# Patient Record
Sex: Female | Born: 1975 | Race: White | Hispanic: No | Marital: Married | Smoking: Never smoker
Health system: Southern US, Community
[De-identification: ages and names within clinical notes are randomized; demographics above are authoritative.]

## PROBLEM LIST (undated history)

## (undated) DIAGNOSIS — I10 Essential (primary) hypertension: Secondary | ICD-10-CM

## (undated) HISTORY — PX: ANTERIOR CRUCIATE LIGAMENT REPAIR: SHX115

## (undated) HISTORY — PX: TONSILLECTOMY: SUR1361

## (undated) HISTORY — PX: DENTAL SURGERY: SHX609

---

## 1998-03-25 ENCOUNTER — Other Ambulatory Visit: Admission: RE | Admit: 1998-03-25 | Discharge: 1998-03-25 | Payer: Self-pay | Admitting: Obstetrics and Gynecology

## 1999-01-06 HISTORY — PX: ANTERIOR CRUCIATE LIGAMENT REPAIR: SHX115

## 1999-07-14 ENCOUNTER — Other Ambulatory Visit: Admission: RE | Admit: 1999-07-14 | Discharge: 1999-07-14 | Payer: Self-pay | Admitting: Obstetrics and Gynecology

## 1999-09-05 ENCOUNTER — Encounter (INDEPENDENT_AMBULATORY_CARE_PROVIDER_SITE_OTHER): Payer: Self-pay | Admitting: Specialist

## 1999-09-05 ENCOUNTER — Other Ambulatory Visit: Admission: RE | Admit: 1999-09-05 | Discharge: 1999-09-05 | Payer: Self-pay | Admitting: Obstetrics and Gynecology

## 1999-12-09 ENCOUNTER — Other Ambulatory Visit: Admission: RE | Admit: 1999-12-09 | Discharge: 1999-12-09 | Payer: Self-pay | Admitting: Obstetrics and Gynecology

## 1999-12-09 ENCOUNTER — Encounter (INDEPENDENT_AMBULATORY_CARE_PROVIDER_SITE_OTHER): Payer: Self-pay

## 2000-01-15 ENCOUNTER — Emergency Department (HOSPITAL_COMMUNITY): Admission: EM | Admit: 2000-01-15 | Discharge: 2000-01-16 | Payer: Self-pay

## 2000-04-04 ENCOUNTER — Emergency Department (HOSPITAL_COMMUNITY): Admission: EM | Admit: 2000-04-04 | Discharge: 2000-04-04 | Payer: Self-pay | Admitting: Emergency Medicine

## 2000-04-04 ENCOUNTER — Encounter: Payer: Self-pay | Admitting: Emergency Medicine

## 2003-02-21 ENCOUNTER — Other Ambulatory Visit: Admission: RE | Admit: 2003-02-21 | Discharge: 2003-02-21 | Payer: Self-pay | Admitting: Obstetrics and Gynecology

## 2003-11-07 ENCOUNTER — Other Ambulatory Visit: Admission: RE | Admit: 2003-11-07 | Discharge: 2003-11-07 | Payer: Self-pay | Admitting: Obstetrics and Gynecology

## 2004-06-10 ENCOUNTER — Other Ambulatory Visit: Admission: RE | Admit: 2004-06-10 | Discharge: 2004-06-10 | Payer: Self-pay | Admitting: Obstetrics and Gynecology

## 2004-11-17 ENCOUNTER — Encounter: Admission: RE | Admit: 2004-11-17 | Discharge: 2004-11-17 | Payer: Self-pay | Admitting: Family Medicine

## 2005-03-19 ENCOUNTER — Other Ambulatory Visit: Admission: RE | Admit: 2005-03-19 | Discharge: 2005-03-19 | Payer: Self-pay | Admitting: Obstetrics and Gynecology

## 2005-07-07 ENCOUNTER — Inpatient Hospital Stay (HOSPITAL_COMMUNITY): Admission: AD | Admit: 2005-07-07 | Discharge: 2005-07-07 | Payer: Self-pay | Admitting: Obstetrics and Gynecology

## 2005-09-23 ENCOUNTER — Inpatient Hospital Stay (HOSPITAL_COMMUNITY): Admission: AD | Admit: 2005-09-23 | Discharge: 2005-09-25 | Payer: Self-pay | Admitting: Obstetrics and Gynecology

## 2006-06-11 ENCOUNTER — Other Ambulatory Visit: Admission: RE | Admit: 2006-06-11 | Discharge: 2006-06-11 | Payer: Self-pay | Admitting: Obstetrics and Gynecology

## 2007-12-22 ENCOUNTER — Other Ambulatory Visit: Admission: RE | Admit: 2007-12-22 | Discharge: 2007-12-22 | Payer: Self-pay | Admitting: Obstetrics and Gynecology

## 2008-04-04 ENCOUNTER — Encounter: Admission: RE | Admit: 2008-04-04 | Discharge: 2008-04-04 | Payer: Self-pay | Admitting: Family Medicine

## 2009-03-28 ENCOUNTER — Other Ambulatory Visit: Admission: RE | Admit: 2009-03-28 | Discharge: 2009-03-28 | Payer: Self-pay | Admitting: Obstetrics and Gynecology

## 2009-08-21 ENCOUNTER — Emergency Department (HOSPITAL_COMMUNITY): Admission: EM | Admit: 2009-08-21 | Discharge: 2009-08-21 | Payer: Self-pay | Admitting: Emergency Medicine

## 2010-03-21 LAB — D-DIMER, QUANTITATIVE: D-Dimer, Quant: <0.22 ug/mL-FEU (ref 0.00–0.48)

## 2010-04-01 ENCOUNTER — Other Ambulatory Visit (HOSPITAL_COMMUNITY)
Admission: RE | Admit: 2010-04-01 | Discharge: 2010-04-01 | Disposition: A | Discharge status: Home / Self Care | Payer: BC Managed Care – PPO | Source: Ambulatory Visit | Attending: Obstetrics and Gynecology | Admitting: Obstetrics and Gynecology

## 2010-04-01 ENCOUNTER — Other Ambulatory Visit: Payer: Self-pay | Admitting: Obstetrics and Gynecology

## 2010-04-01 DIAGNOSIS — Z01419 Encounter for gynecological examination (general) (routine) without abnormal findings: Secondary | ICD-10-CM | POA: Insufficient documentation

## 2010-05-23 NOTE — H&P (Signed)
Rachel Huynh, Rachel Huynh                   ACCOUNT NO.:  [id number]   MEDICAL RECORD NO.:  [id number]          PATIENT TYPE:  INP   LOCATION:  9166                          FACILITY:  [facility]   PHYSICIAN:  Gerald Leitz, MD          DATE OF BIRTH:  1975-08-02   DATE OF ADMISSION:  09/23/2005  DATE OF DISCHARGE:                                HISTORY & PHYSICAL   HISTORY OF PRESENT ILLNESS:  This is a 35 year old G2, P0-0-1-0 at 87 weeks'  estimated gestational age based on last menstrual period of December 31, 2004, confirmed by 7-week ultrasound with estimated date of delivery October 07, 2005.  Pregnancy is complicated by history of LEEP procedure.  Patient  presented to the office complaining of leakage of fluid which began at 9  p.m. on September 18.  She described it as clear liquid.  Irregular  contractions.  No vaginal bleeding.  Positive fetal movement.   PRENATAL LABS:  Blood type O positive.  Antibody screen negative.  RPR  nonreactive.  Rubella immune.  Hepatitis B negative.  HIV nonreactive.  Gonorrhea/chlamydia are negative.  GBS negative.   PAST MEDICAL HISTORY:  1. Mitral valve prolapse.  2. Irritable bowel syndrome.   PAST SURGICAL HISTORY:  1. Left knee replacement.  2. Tonsillectomy.  3. Oral surgery.   PAST GYN HISTORY:  1. No history of sexually transmitted diseases.  2. Patient has a history of abnormal Pap smear status-post a LEEP 6 years      ago.  Pap smears have been normal since.   PAST OB HISTORY:  Spontaneous abortion x1.   MEDICATIONS:  Prenatal vitamins.   ALLERGIES:  1. NEOSPORIN.  2. PENICILLIN.  3. AZITHROMYCIN.   FAMILY HISTORY:  Mother with type 1 diabetes.   SOCIAL HISTORY:  Patient is married.  She denies tobacco, alcohol, or  illicit drug use.   PHYSICAL EXAMINATION:  VITAL SIGNS:  Patient's weight is 246 pounds.  Blood  pressure 110/74.  Fetal heart rate was 130.  CARDIOVASCULAR:  Regular rate and rhythm.  LUNGS:  Clear to auscultation  bilaterally.  ABDOMEN:  Gravid, nontender.  EXTREMITIES:  Trace edema.  PELVIC EXAM:  Nitrazine positive.  Fern positive.  The cervix is 1 cm thick,  -2 station, vertex.   IMPRESSION AND PLAN:  A 38-week intrauterine pregnancy with rupture of  membranes.  We will admit for Cytotec, prolonged rupture, clindamycin.  Anticipate spontaneous vaginal delivery.      Gerald Leitz, MD  Electronically Signed     TC/MEDQ  D:  09/23/2005  T:  09/23/2005  Job:  [id number]

## 2010-07-30 ENCOUNTER — Ambulatory Visit: Payer: Self-pay

## 2010-07-30 ENCOUNTER — Other Ambulatory Visit: Payer: Self-pay | Admitting: Occupational Medicine

## 2010-07-30 DIAGNOSIS — R52 Pain, unspecified: Secondary | ICD-10-CM

## 2011-01-10 ENCOUNTER — Ambulatory Visit: Payer: Self-pay

## 2011-02-09 ENCOUNTER — Ambulatory Visit
Admission: RE | Admit: 2011-02-09 | Discharge: 2011-02-09 | Disposition: A | Discharge status: Home / Self Care | Payer: BC Managed Care – PPO | Source: Ambulatory Visit | Attending: Family Medicine | Admitting: Family Medicine

## 2011-02-09 ENCOUNTER — Other Ambulatory Visit: Payer: Self-pay | Admitting: Family Medicine

## 2011-02-09 MED ORDER — IOHEXOL 300 MG/ML  SOLN
100.0000 mL | Freq: Once | INTRAMUSCULAR | Status: AC | PRN
  Administered 2011-02-09: 100 mL via INTRAVENOUS

## 2011-02-11 ENCOUNTER — Other Ambulatory Visit: Payer: Self-pay | Admitting: Family Medicine

## 2011-02-11 DIAGNOSIS — R209 Unspecified disturbances of skin sensation: Secondary | ICD-10-CM

## 2011-02-12 ENCOUNTER — Ambulatory Visit
Admission: RE | Admit: 2011-02-12 | Discharge: 2011-02-12 | Disposition: A | Discharge status: Home / Self Care | Payer: BC Managed Care – PPO | Source: Ambulatory Visit | Attending: Family Medicine | Admitting: Family Medicine

## 2011-02-12 DIAGNOSIS — R209 Unspecified disturbances of skin sensation: Secondary | ICD-10-CM

## 2011-02-12 MED ORDER — GADOBENATE DIMEGLUMINE 529 MG/ML IV SOLN
17.0000 mL | Freq: Once | INTRAVENOUS | Status: AC | PRN
  Administered 2011-02-12: 17 mL via INTRAVENOUS

## 2011-04-01 ENCOUNTER — Other Ambulatory Visit: Payer: Self-pay | Admitting: Obstetrics and Gynecology

## 2011-04-01 ENCOUNTER — Other Ambulatory Visit (HOSPITAL_COMMUNITY)
Admission: RE | Admit: 2011-04-01 | Discharge: 2011-04-01 | Disposition: A | Discharge status: Home / Self Care | Payer: BC Managed Care – PPO | Source: Ambulatory Visit | Attending: Obstetrics and Gynecology | Admitting: Obstetrics and Gynecology

## 2011-04-01 DIAGNOSIS — Z01419 Encounter for gynecological examination (general) (routine) without abnormal findings: Secondary | ICD-10-CM | POA: Insufficient documentation

## 2011-04-01 DIAGNOSIS — Z1159 Encounter for screening for other viral diseases: Secondary | ICD-10-CM | POA: Insufficient documentation

## 2011-04-14 ENCOUNTER — Ambulatory Visit
Admission: RE | Admit: 2011-04-14 | Discharge: 2011-04-14 | Disposition: A | Discharge status: Home / Self Care | Payer: BC Managed Care – PPO | Source: Ambulatory Visit | Attending: Family Medicine | Admitting: Family Medicine

## 2011-04-14 ENCOUNTER — Other Ambulatory Visit: Payer: Self-pay | Admitting: Family Medicine

## 2011-04-14 DIAGNOSIS — R059 Cough, unspecified: Secondary | ICD-10-CM

## 2011-04-14 DIAGNOSIS — R05 Cough: Secondary | ICD-10-CM

## 2011-12-21 ENCOUNTER — Other Ambulatory Visit: Payer: Self-pay | Admitting: Obstetrics and Gynecology

## 2011-12-21 DIAGNOSIS — Z1231 Encounter for screening mammogram for malignant neoplasm of breast: Secondary | ICD-10-CM

## 2012-01-18 ENCOUNTER — Ambulatory Visit
Admission: RE | Admit: 2012-01-18 | Discharge: 2012-01-18 | Disposition: A | Discharge status: Home / Self Care | Payer: BC Managed Care – PPO | Source: Ambulatory Visit | Attending: Obstetrics and Gynecology | Admitting: Obstetrics and Gynecology

## 2012-01-18 DIAGNOSIS — Z1231 Encounter for screening mammogram for malignant neoplasm of breast: Secondary | ICD-10-CM

## 2012-05-11 ENCOUNTER — Other Ambulatory Visit (HOSPITAL_COMMUNITY)
Admission: RE | Admit: 2012-05-11 | Discharge: 2012-05-11 | Disposition: A | Discharge status: Home / Self Care | Payer: BC Managed Care – PPO | Source: Ambulatory Visit | Attending: Obstetrics and Gynecology | Admitting: Obstetrics and Gynecology

## 2012-05-11 ENCOUNTER — Other Ambulatory Visit: Payer: Self-pay | Admitting: Obstetrics and Gynecology

## 2012-05-11 DIAGNOSIS — Z01419 Encounter for gynecological examination (general) (routine) without abnormal findings: Secondary | ICD-10-CM | POA: Insufficient documentation

## 2012-05-11 DIAGNOSIS — Z1151 Encounter for screening for human papillomavirus (HPV): Secondary | ICD-10-CM | POA: Insufficient documentation

## 2013-01-30 IMAGING — CR DG HAND COMPLETE 3+V*L*
3 series · 3 of 3 positions shown · non-contrast
Comparison: None.

CLINICAL DATA: Pain and swelling secondary to blunt trauma today.

LEFT HAND - COMPLETE 3+ VIEW

[view not recorded (1 of 3)]
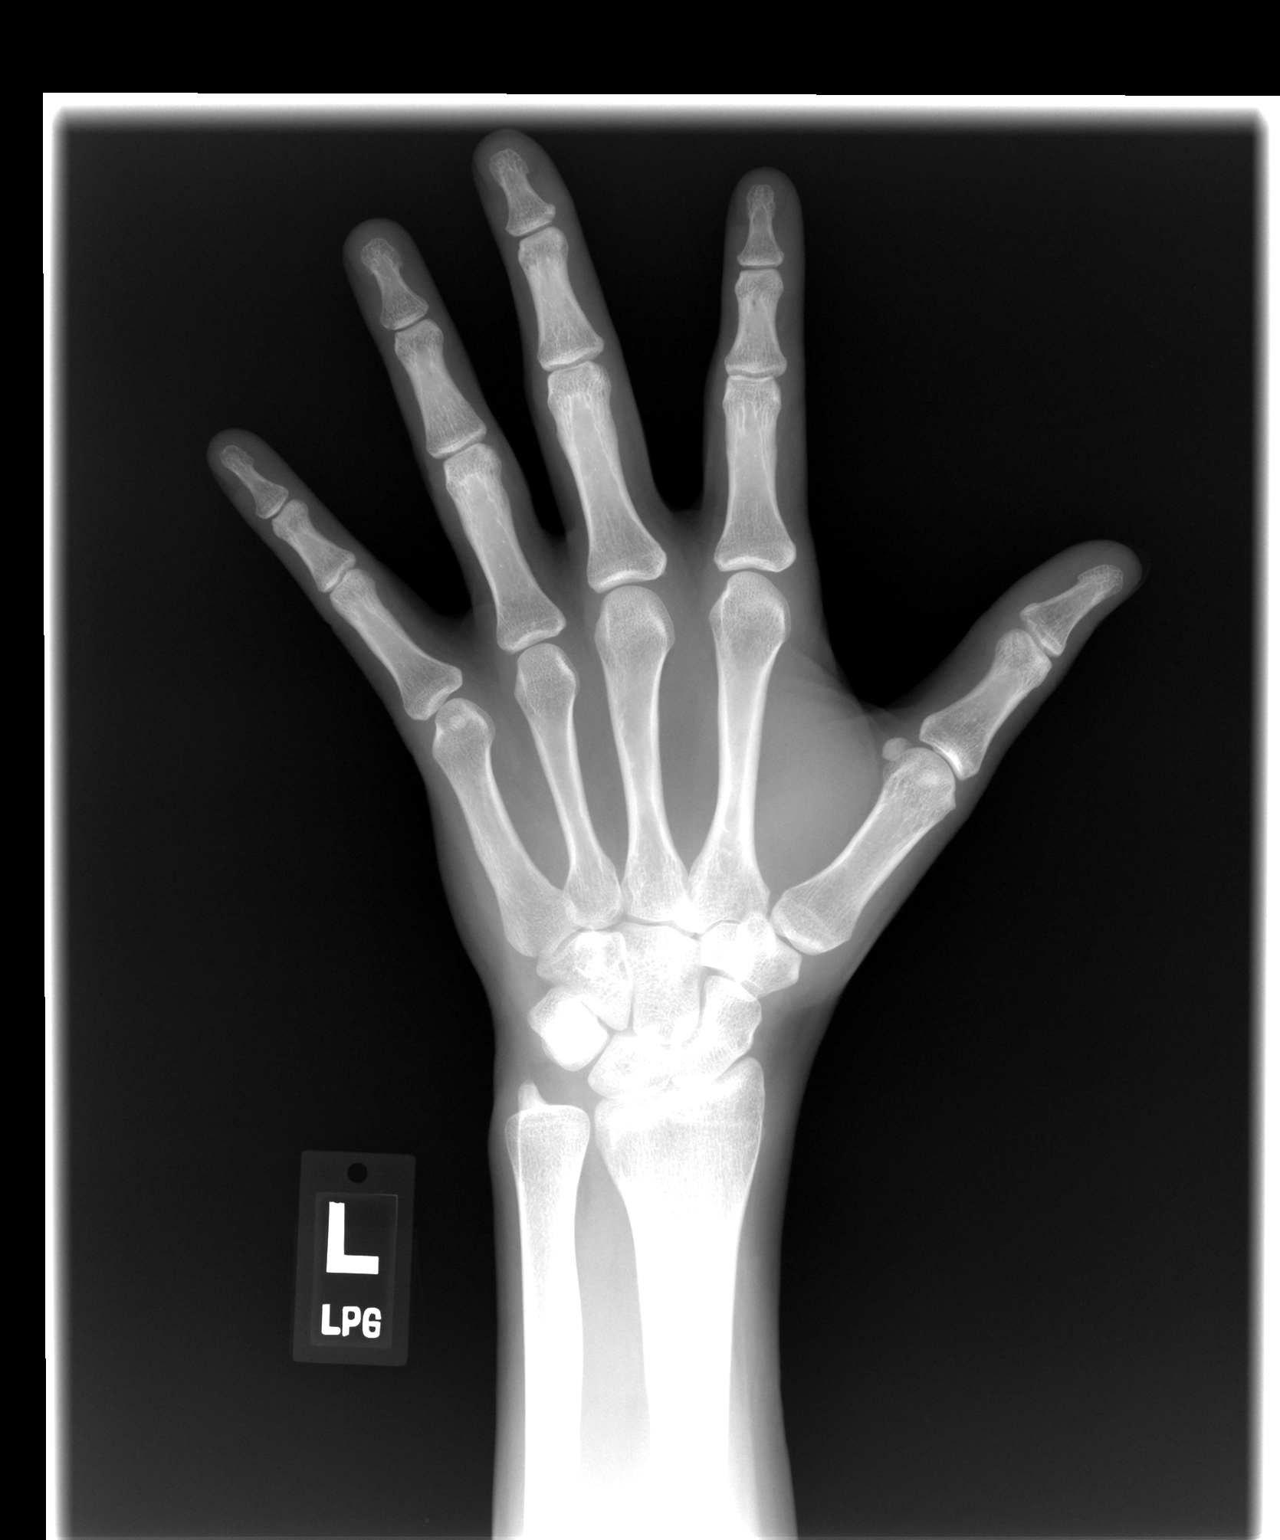

[view not recorded (2 of 3)]
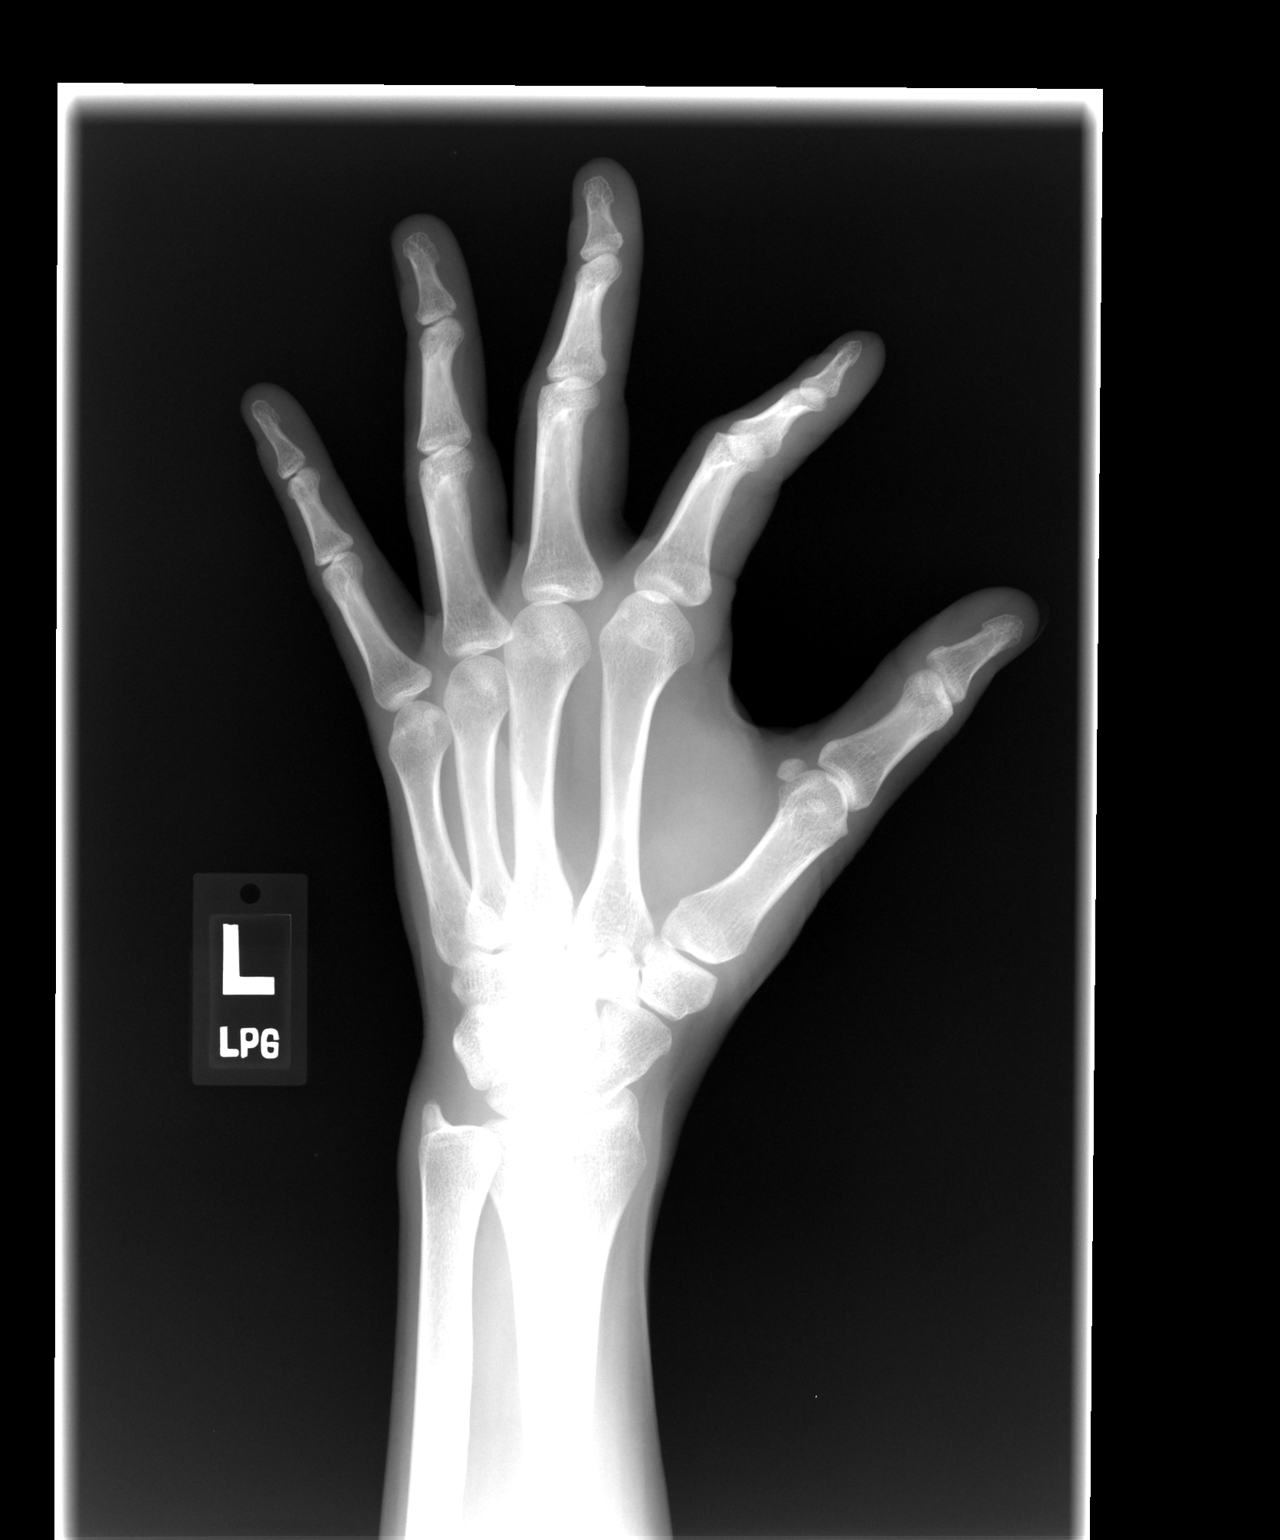

[view not recorded (3 of 3)]
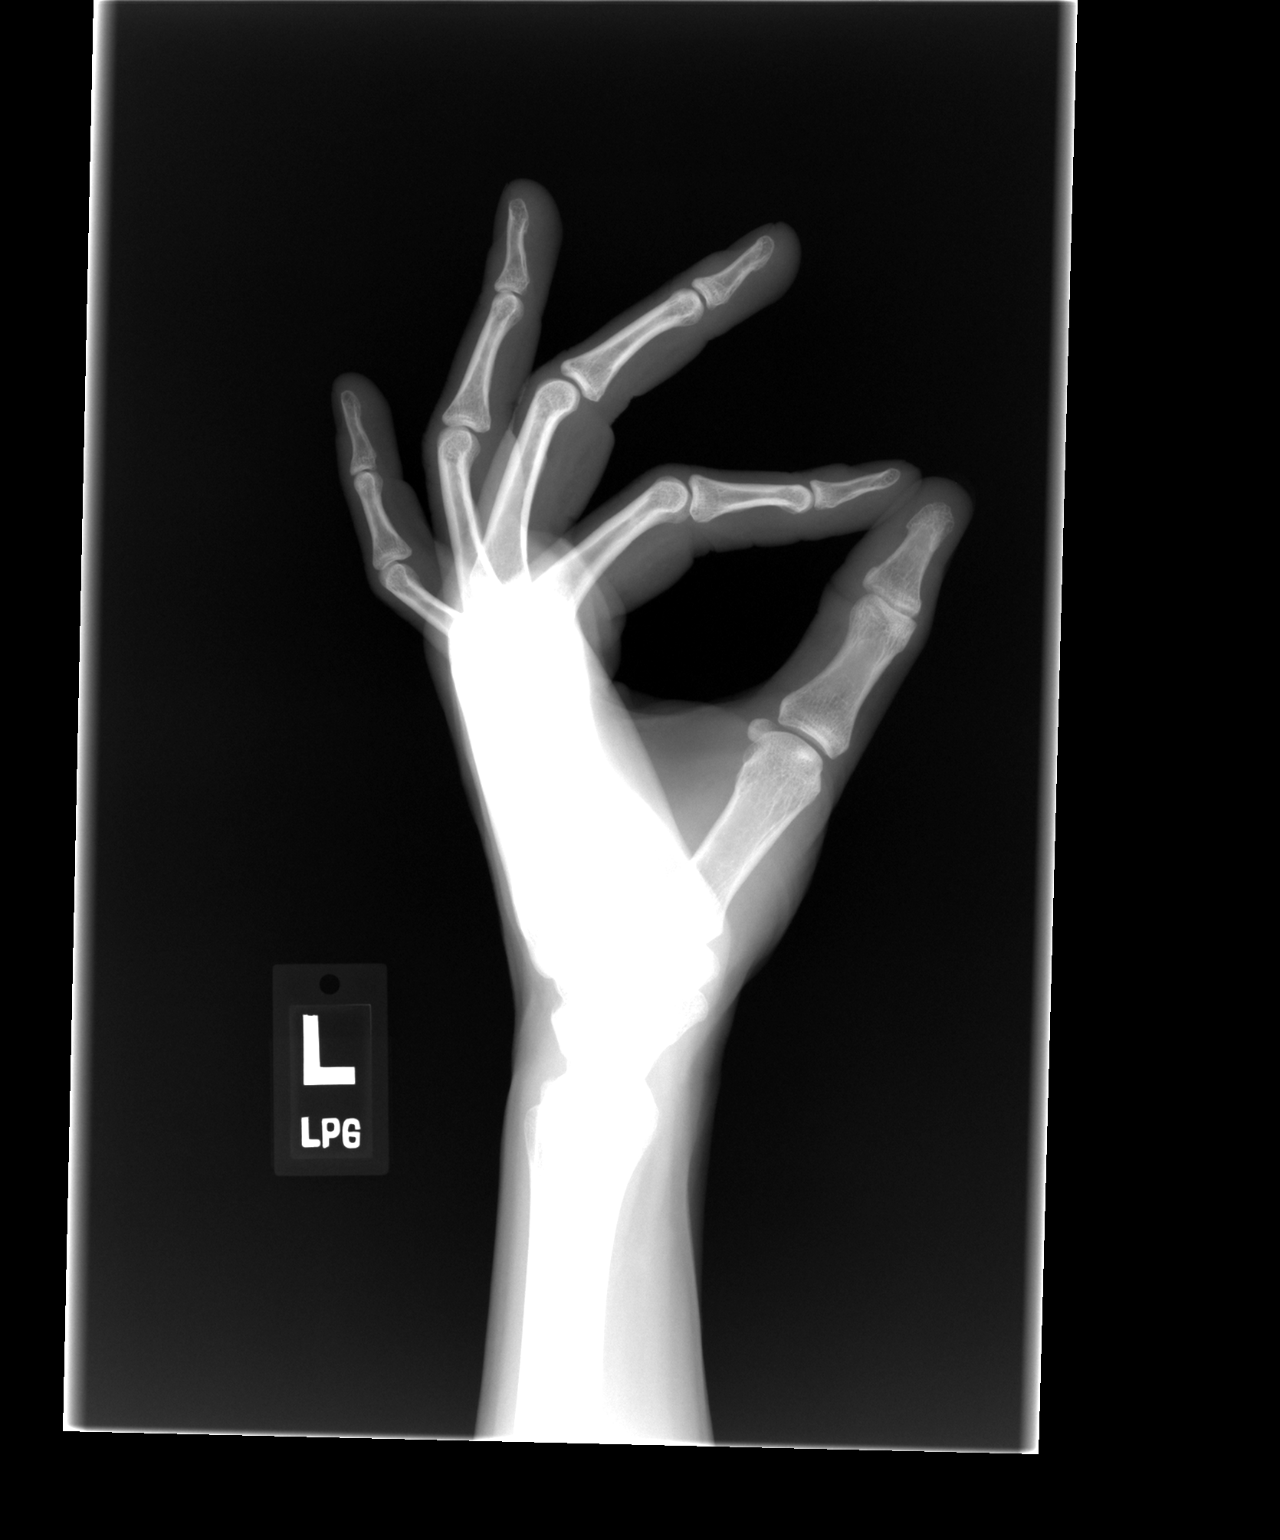

[3 of 3 positions shown; findings below may reference images not displayed]

FINDINGS: No fracture, dislocation, or other abnormality.
IMPRESSION: Normal exam.

## 2013-03-09 ENCOUNTER — Emergency Department (HOSPITAL_COMMUNITY)
Admission: EM | Admit: 2013-03-09 | Discharge: 2013-03-09 | Disposition: A | Discharge status: Home / Self Care | Payer: BC Managed Care – PPO | Attending: Emergency Medicine | Admitting: Emergency Medicine

## 2013-03-09 ENCOUNTER — Emergency Department (HOSPITAL_COMMUNITY): Payer: BC Managed Care – PPO

## 2013-03-09 ENCOUNTER — Encounter (HOSPITAL_COMMUNITY): Payer: Self-pay | Admitting: Emergency Medicine

## 2013-03-09 DIAGNOSIS — Y929 Unspecified place or not applicable: Secondary | ICD-10-CM | POA: Insufficient documentation

## 2013-03-09 DIAGNOSIS — Y9389 Activity, other specified: Secondary | ICD-10-CM | POA: Insufficient documentation

## 2013-03-09 DIAGNOSIS — Z79899 Other long term (current) drug therapy: Secondary | ICD-10-CM | POA: Insufficient documentation

## 2013-03-09 DIAGNOSIS — S199XXA Unspecified injury of neck, initial encounter: Secondary | ICD-10-CM

## 2013-03-09 DIAGNOSIS — S0990XA Unspecified injury of head, initial encounter: Secondary | ICD-10-CM | POA: Insufficient documentation

## 2013-03-09 DIAGNOSIS — R51 Headache: Secondary | ICD-10-CM

## 2013-03-09 DIAGNOSIS — Z880 Allergy status to penicillin: Secondary | ICD-10-CM | POA: Insufficient documentation

## 2013-03-09 DIAGNOSIS — R519 Headache, unspecified: Secondary | ICD-10-CM

## 2013-03-09 DIAGNOSIS — W208XXA Other cause of strike by thrown, projected or falling object, initial encounter: Secondary | ICD-10-CM | POA: Insufficient documentation

## 2013-03-09 DIAGNOSIS — S0993XA Unspecified injury of face, initial encounter: Secondary | ICD-10-CM | POA: Insufficient documentation

## 2013-03-09 MED ORDER — ONDANSETRON HCL 4 MG PO TABS: 4.0000 mg | ORAL_TABLET | Freq: Four times a day (QID) | ORAL | Status: DC

## 2013-03-09 NOTE — ED Notes (Signed)
Pt states she "hasn't felt normal" since the injury. States feels dizzy and had one episode of nausea. None at present.

## 2013-03-09 NOTE — Discharge Instructions (Signed)
Please call your doctor for a followup appointment within 24-48 hours. When you talk to your doctor please let them know that you were seen in the emergency department and have them acquire all of your records so that they can discuss the findings with you and formulate a treatment plan to fully care for your new and ongoing problems. Please call and set-up an appointment with your primary care provider to be re-assessed within the next 24-48 hours Please call and set-up an appointment with neurology if symptoms continue Please rest and stay hydrated Please take medications as prescribed and as needed for nausea control Please drink plenty of fluids Please continue to monitor symptoms and if symptoms are to worsen or change (fever greater than 101, chills, sweating, neck pain, neck stiffness, chest pain, shortness of breath, difficulty breathing, headache, blurred vision, nausea, vomiting, fainting symptoms, sudden loss of vision) please report back to the ED   Head Injury, Adult You have received a head injury. It does not appear serious at this time. Headaches and vomiting are common following head injury. It should be easy to awaken from sleeping. Sometimes it is necessary for you to stay in the emergency department for a while for observation. Sometimes admission to the hospital may be needed. After injuries such as yours, most problems occur within the first 24 hours, but side effects may occur up to 7 10 days after the injury. It is important for you to carefully monitor your condition and contact your health care provider or seek immediate medical care if there is a change in your condition. WHAT ARE THE TYPES OF HEAD INJURIES? Head injuries can be as minor as a bump. Some head injuries can be more severe. More severe head injuries include:  A jarring injury to the brain (concussion).  A bruise of the brain (contusion). This mean there is bleeding in the brain that can cause swelling.  A  cracked skull (skull fracture).  Bleeding in the brain that collects, clots, and forms a bump (hematoma). WHAT CAUSES A HEAD INJURY? A serious head injury is most likely to happen to someone who is in a car wreck and is not wearing a seat belt. Other causes of major head injuries include bicycle or motorcycle accidents, sports injuries, and falls. HOW ARE HEAD INJURIES DIAGNOSED? A complete history of the event leading to the injury and your current symptoms will be helpful in diagnosing head injuries. Many times, pictures of the brain, such as CT or MRI are needed to see the extent of the injury. Often, an overnight hospital stay is necessary for observation.  WHEN SHOULD I SEEK IMMEDIATE MEDICAL CARE?  You should get help right away if:  You have confusion or drowsiness.  You feel sick to your stomach (nauseous) or have continued, forceful vomiting.  You have dizziness or unsteadiness that is getting worse.  You have severe, continued headaches not relieved by medicine. Only take over-the-counter or prescription medicines for pain, fever, or discomfort as directed by your health care provider.  You do not have normal function of the arms or legs or are unable to walk.  You notice changes in the black spots in the center of the colored part of your eye (pupil).  You have a clear or bloody fluid coming from your nose or ears.  You have a loss of vision. During the next 24 hours after the injury, you must stay with someone who can watch you for the warning signs. This person  should contact local emergency services (911 in the U.[place] if you have seizures, you become unconscious, or you are unable to wake up. HOW CAN I PREVENT A HEAD INJURY IN THE FUTURE? The most important factor for preventing major head injuries is avoiding motor vehicle accidents. To minimize the potential for damage to your head, it is crucial to wear seat belts while riding in motor vehicles. Wearing helmets while bike  riding and playing collision sports (like football) is also helpful. Also, avoiding dangerous activities around the house will further help reduce your risk of head injury.  WHEN CAN I RETURN TO NORMAL ACTIVITIES AND ATHLETICS? You should be reevaluated by your health care provider before returning to these activities. If you have any of the following symptoms, you should not return to activities or contact sports until 1 week after the symptoms have stopped:  Persistent headache.  Dizziness or vertigo.  Poor attention and concentration.  Confusion.  Memory problems.  Nausea or vomiting.  Fatigue or tire easily.  Irritability.  Intolerant of bright lights or loud noises.  Anxiety or depression.  Disturbed sleep. MAKE SURE YOU:   Understand these instructions.  Will watch your condition.  Will get help right away if you are not doing well or get worse. Document Released: 12/22/2004 Document Revised: 10/12/2012 Document Reviewed: 08/29/2012 [facility] Patient Information 2014 [place], [place].   Emergency Department Resource Guide 1) Find a Doctor and Pay Out of Pocket Although you won't have to find out who is covered by your insurance plan, it is a good idea to ask around and get recommendations. You will then need to call the office and see if the doctor you have chosen will accept you as a new patient and what types of options they offer for patients who are self-pay. Some doctors offer discounts or will set up payment plans for their patients who do not have insurance, but you will need to ask so you aren't surprised when you get to your appointment.  2) Contact Your Local Health Department Not all health departments have doctors that can see patients for sick visits, but many do, so it is worth a call to see if yours does. If you don't know where your local health department is, you can check in your phone book. The CDC also has a tool to help you locate your state's health  department, and many state websites also have listings of all of their local health departments.  3) Find a Walk-in Clinic If your illness is not likely to be very severe or complicated, you may want to try a walk in clinic. These are popping up all over the country in pharmacies, drugstores, and shopping centers. They're usually staffed by nurse practitioners or physician assistants that have been trained to treat common illnesses and complaints. They're usually fairly quick and inexpensive. However, if you have serious medical issues or chronic medical problems, these are probably not your best option.  No Primary Care Doctor: - Call Health Connect at  [phone] - they can help you locate a primary care doctor that  accepts your insurance, provides certain services, etc. - Physician Referral Service- [phone]  Chronic Pain Problems: Organization         Address  Phone   Notes  [facility]  [phone] Patients need to be referred by their primary care doctor.   Medication Assistance: Organization         Address  Phone   Notes  [employer] [facility]ication Assistance Program [address] [place], [place] [address] [phone] --Must be a resident of [facility] -- Must have NO insurance coverage whatsoever (no Medicaid/ Medicare, etc.) -- The pt. MUST have a primary care doctor that directs their care regularly and follows them in the community   [facility]ist  [phone]   [employer]  [phone]    Agencies that provide inexpensive medical care: Organization         Address  Phone   Notes  [place]  [phone]   [facility]    [phone]   [facility] [place], [place] [address] [phone]   [place] [address] [place] [phone]   Planned Parenthood    [phone]   [facility]    [phone]    [place] and [place] [address], [place] Phone:  [phone], Fax:  [phone] Hours of Operation:  9 am - 6 pm, M-F.  Also accepts Medicaid/Medicare and self-pay.  [facility] for [place], [address], [place] Phone: [phone], Fax: [phone]. Hours of Operation:  8:30 am - 5:30 pm, M-F.  Also accepts Medicaid and self-pay.  [facility] [place] [address], [place] Phone: [phone]   [place], [place], [place] [phone], Ext. [phone] Mondays & Thursdays: 7-9 AM.  First 15 patients are seen on a first come, first serve basis.    [place] Providers:  Organization         Address  Phone   Notes  [facility] [address], [place] [phone] Also accepts self-pay patients.  [facility] [address], [place]  [phone]   [place], [address], [place] [phone]   [facility] [address] [place] [phone]   Lucianne Lei [address], [place]   [phone] Only accepts [place] Access [place] patients after they have their name applied to their card.   Self-Pay (no insurance) in [facility]:  Organization         Address  Phone   Notes  Sickle Cell Patients, [facility] [place] [phone]   [facility] [place] [phone]   [facility] [place]  [place], [place], [place] [phone]   [facility]/Dr. Osei-Bonsu  [address] [place] or [address], [place] [phone] Phone number for both [place] and [place] locations is the same.  [facility] [place] [address] [place] [phone]    [facility] [address], [place] or [address] [phone]   [facility] [address], [place] [phone], phone; [phone], fax Sees patients 1st and 3rd Saturday of every month.  Must not qualify for public or private insurance (i.e. Medicaid, Medicare, [place] Health Choice, Veterans' Benefits)  Household income should be no more than 200% of the poverty level The clinic cannot treat you if you are pregnant or think you are pregnant  Sexually transmitted diseases are  not treated at the clinic.    Dental Care: Organization         Address  Phone  Notes  [facility] of [facility] [place] [phone] Accepts children up to age 19 who are enrolled in [place] or [place]; pregnant women with a Medicaid card; and children who have applied for Medicaid or [place] Health Choice, but were declined, whose parents can pay a reduced fee at time of service.  [facility] Department of [place] [facility]  [address], [place] [phone] Accepts children up to age 82 who are enrolled in [place] or [place]; pregnant women with a Medicaid card; and children who have applied for Medicaid or [place] Health Choice, but were declined, whose parents can pay a reduced fee at time of service.  [facility] Adult [facility] Access PROGRAM  [place] [phone] Patients are seen by appointment only. Walk-ins are not accepted. [place] will see patients 66 years of age and older. Monday - Tuesday (8am-5pm) Most Wednesdays (8:30-5pm) $30 per visit, cash only  [facility] Adult Dental Access PROGRAM  [address], [facility] [phone] Patients are seen by appointment only. Walk-ins are not accepted. [place] will see patients 19 years of age and older. One Wednesday Evening (Monthly: Volunteer Based).   $30 per visit, cash only  [place]  [phone] for adults; Children under age 29, call Graduate Pediatric Dentistry at [phone]. Children aged 80-14, please call [phone] to request a pediatric application.  Dental services are provided in all areas of dental care including fillings, crowns and bridges, complete and partial dentures, implants, gum treatment, root canals, and extractions. Preventive care is also provided. Treatment is provided to both adults and children. Patients are selected via a lottery and there is often a waiting list.   [facility] [address] [place]  [phone] [email or web address]   [facility] [address] [place], [place] [phone], Ext. [phone] Second and Fourth Thursday of each month, opens at 6:30 AM; Clinic ends at 9 AM.  Patients are seen on a first-come first-served basis, and a limited number are seen during each clinic.   [facility]  [address] Hillard Danker [place], [place] [phone]   Eligibility Requirements You must have lived in [place], [place], or [place] counties for at least the last three months.   You cannot be eligible for state or federal sponsored [employer], including [employer], [place], or [employer].   You generally cannot be eligible for healthcare insurance through your employer.    How to apply: Eligibility screenings are held every Tuesday and Wednesday afternoon from 1:00 pm until 4:00 pm. You do not need an appointment for the interview!  [facility] [address] [place], [place]   [place]  [place] Department  [place]  [phone]    Behavioral Health Resources in the Community: Intensive Outpatient Programs Organization         Address  Phone  Notes  [place]. [address], [place], [place] [phone]   [facility] Outpatient [address] [place], [place]   ADS: Alcohol & Drug Svcs [address], [place], [place]  [place] [address],  [place], [place] or [phone]   Substance Abuse Resources Organization         Address  Phone  Notes  Alcohol and Drug Services  [phone]   [place]  [phone]   [facility] [place]   Chinita Pester  [phone]   Residential & Outpatient Substance Abuse Program  ([phone]   Psychological Services Organization         Address  Phone  Notes  [facility] [place]  [place]  [phone]   [place] [address], [place] or [phone]    Mobile Crisis Teams Organization         Address  Phone  Notes  [facility], Mobile Crisis Care Unit  [phone]   Assertive Psychotherapeutic Services  [address] [place], [place]   [place] [address] [place] [phone]    Self-Help/Support Groups Organization         Address  Phone             Notes  [place]. of [place] - variety of support groups  [place] Call for more information  Narcotics Anonymous (NA), [facility] [address], [employer] [place]  2 meetings at this location   Special educational needs teacher         Address  Phone  Notes  ASAP Residential Treatment [place],    [place]  [phone]   [facility]  [address] [place] [phone], [place], [place]   [place], [place] [phone] Admissions: 8am-3pm M-F  Incentives Substance [place] [address],    [place], [place] [phone]   [facility] [address] [place], [place], [place]   [facility] [address]  [place], [place]   [facility] Programs - Intensive Outpatient [address], [place], [place]   [facility] [place].,  [place], [place] [phone] or [phone]   [facility]idential Treatment Services (RTS) [address] [place], [place] Accepts Medicaid  [facility] [place] [address],  [place] [phone] Substance Abuse/Addiction Treatment   [facility] Organization         Address  Phone  Notes  [facility]  [phone]   Domenic Schwab, PhD [address] [place], [place]   [phone] or [phone]   [place], [place] [phone]   [facility] [address], [place], [place] [phone] Insurance/Medicaid/sponsorship through [facility] and Families [address] [place]                                    [place], [place] [phone] [place] [address] [place], [place] [phone]    Dr. Adele Schilder  [phone]   [facility] Dept. 1) [address] [place] 2) [place] 3)  [place] [address], [place] [phone]  [phone]   [place] [phone] or [phone] (After Hours)

## 2013-03-09 NOTE — ED Notes (Signed)
Pt presents with injury to back of head yesterday.  Pt reports putting groceries in the back of her SUV when glass plate fell onto her head.  Pt reports brief LOC, no laceration reported.  Pt reports today, she has had 1 episode of nausea and multiple episodes of "feeling unsteady".

## 2013-03-09 NOTE — ED Notes (Signed)
Vital signs stable and GCS 15.

## 2013-03-09 NOTE — ED Provider Notes (Signed)
CSN: [id number]     Arrival date & time 03/09/13  1314 History  This chart was scribed for non-physician practitioner, Raymon Mutton, PA-C working with Joya Gaskins, MD by Greggory Stallion, ED scribe. This patient was seen in room TR06C/TR06C and the patient's care was started at 3:15 PM.    No chief complaint on file.  The history is provided by the patient. No language interpreter was used.   HPI Comments: Rachel Huynh is a 38 y.o. female who presents to the Emergency Department complaining of a head injury that occurred yesterday. She states she was getting groceries out of her SUV and the glass plate fell onto the back of her head. Denies syncope or collapsing. She has sudden onset headache but states it is more dull now. Pt states she has had intermittent tinnitus since the incident and mild neck pain. She also had one episode of nausea today. Pt has taken ibuprofen with little relief. Denies sudden loss of vision, blurred vision, chest pain, SOB, difficulty breathing, emesis, neck stiffness, numbness or tingling, slurred speech.    History reviewed. No pertinent past medical history. Past Surgical History  Procedure Laterality Date  . Anterior cruciate ligament repair    . Tonsillectomy    . Dental surgery     History reviewed. No pertinent family history. History  Substance Use Topics  . Smoking status: Never Smoker   . Smokeless tobacco: Not on file  . Alcohol Use: Yes   OB History   Grav Para Term Preterm Abortions TAB SAB Ect Mult Living                 Review of Systems  HENT: Positive for tinnitus.   Eyes: Negative for visual disturbance.  Respiratory: Negative for shortness of breath.   Cardiovascular: Negative for chest pain.  Gastrointestinal: Positive for nausea. Negative for vomiting.  Musculoskeletal: Positive for neck pain. Negative for neck stiffness.  Neurological: Positive for light-headedness and headaches. Negative for syncope and speech difficulty.  All  other systems reviewed and are negative.   Allergies  Neosporin; Zithromax; and Penicillins  Home Medications   Current Outpatient Rx  Name  Route  Sig  Dispense  Refill  . norgestrel-ethinyl estradiol (LOW-OGESTREL) 0.3-30 MG-MCG tablet   Oral   Take 1 tablet by mouth daily.         Marland Kitchen venlafaxine XR (EFFEXOR-XR) 75 MG 24 hr capsule   Oral   Take 75 mg by mouth daily with breakfast.         . ondansetron (ZOFRAN) 4 MG tablet   Oral   Take 1 tablet (4 mg total) by mouth every 6 (six) hours.   12 tablet   0    BP 138/96  Pulse 70  Temp(Src) 98.6 F (37 C) (Oral)  Resp 16  Ht 5\' 9"  (1.753 m)  Wt 169 lb (76.658 kg)  BMI 24.95 kg/m2  SpO2 96%  LMP 02/19/2013  Physical Exam  Nursing note and vitals reviewed. Constitutional: She is oriented to person, place, and time. She appears well-developed and well-nourished. No distress.  HENT:  Head: Normocephalic and atraumatic. Not macrocephalic and not microcephalic. Head is without raccoon's eyes and without Battle's sign.    Mouth/Throat: Oropharynx is clear and moist. No oropharyngeal exudate.  Discomfort upon palpation to the vertex of the head with rightward deviation.   Eyes: Conjunctivae and EOM are normal. Pupils are equal, round, and reactive to light. Right eye exhibits no discharge. Left  eye exhibits no discharge.  Negative nystagmus Visual fields grossly intact   Neck: Normal range of motion. Neck supple. No tracheal deviation present.  Negative neck stiffness Negative nuchal rigidity Negative cervical LAD Negative pain upon palpation to the c-spine  Cardiovascular: Normal rate, regular rhythm and normal heart sounds.   Pulses:      Radial pulses are 2+ on the right side, and 2+ on the left side.       Dorsalis pedis pulses are 2+ on the right side, and 2+ on the left side.  Pulmonary/Chest: Effort normal and breath sounds normal. No respiratory distress. She has no wheezes. She has no rales.   Musculoskeletal: Normal range of motion. She exhibits no tenderness.  Full ROM to upper and lower extremities without difficulty noted, negative ataxia noted.  Lymphadenopathy:    She has no cervical adenopathy.  Neurological: She is alert and oriented to person, place, and time. No cranial nerve deficit. She exhibits normal muscle tone. Coordination normal.  Cranial nerves III-XII grossly intact Strength 5+/5+ to upper and lower extremities bilaterally with resistance applied, equal distribution noted Sensation intact to upper and lower extremities bilaterally with differentiation to sharp and dull touch  Negative Romberg Patient able to bring finger to nose without difficulty  Patient able to walk heel-to-toe and on tippy toes without difficulty or change in balance.  Heel to knee down shin normal bilaterally Gait proper, proper balance - negative sway, negative drift, negative step-offs  Skin: Skin is warm and dry.  Psychiatric: She has a normal mood and affect. Her behavior is normal.    ED Course  Procedures (including critical care time)  DIAGNOSTIC STUDIES: Oxygen Saturation is 96% on RA, normal by my interpretation.    COORDINATION OF CARE: 3:23 PM-Discussed treatment plan which includes head and neck CTs with pt at bedside and pt agreed to plan.   Ct Head Wo Contrast  03/09/2013   CLINICAL DATA:  INJURY TO THE BACK OF THE HEAD YESTERDAY. BRIEF LOSS OF CONSCIOUSNESS.  EXAM: CT HEAD WITHOUT CONTRAST  CT CERVICAL SPINE WITHOUT CONTRAST  TECHNIQUE: Multidetector CT imaging of the head and cervical spine was performed following the standard protocol without intravenous contrast. Multiplanar CT image reconstructions of the cervical spine were also generated.  COMPARISON:  MR HEAD WO/W CM dated 02/12/2011  FINDINGS: CT HEAD FINDINGS  There is no evidence of mass effect, midline shift or extra-axial fluid collections. There is no evidence of a space-occupying lesion or intracranial  hemorrhage. There is no evidence of a cortical-based area of acute infarction.  The ventricles and sulci are appropriate for the patient's age. The basal cisterns are patent.  Visualized portions of the orbits are unremarkable. The visualized portions of the paranasal sinuses and mastoid air cells are unremarkable.  The osseous structures are unremarkable.  CT CERVICAL SPINE FINDINGS  The alignment is anatomic. The vertebral body heights are maintained. There is no acute fracture. There is no static listhesis. The prevertebral soft tissues are normal. The intraspinal soft tissues are not fully imaged on this examination due to poor soft tissue contrast, but there is no gross soft tissue abnormality.  The disc spaces are maintained.  The visualized portions of the lung apices demonstrate no focal abnormality.  IMPRESSION: 1. No acute intracranial pathology. 2. No acute osseous injury of the cervical spine.   Electronically Signed   By: Elige Ko   On: 03/09/2013 17:04   Ct Cervical Spine Wo Contrast  03/09/2013  CLINICAL DATA:  INJURY TO THE BACK OF THE HEAD YESTERDAY. BRIEF LOSS OF CONSCIOUSNESS.  EXAM: CT HEAD WITHOUT CONTRAST  CT CERVICAL SPINE WITHOUT CONTRAST  TECHNIQUE: Multidetector CT imaging of the head and cervical spine was performed following the standard protocol without intravenous contrast. Multiplanar CT image reconstructions of the cervical spine were also generated.  COMPARISON:  MR HEAD WO/W CM dated 02/12/2011  FINDINGS: CT HEAD FINDINGS  There is no evidence of mass effect, midline shift or extra-axial fluid collections. There is no evidence of a space-occupying lesion or intracranial hemorrhage. There is no evidence of a cortical-based area of acute infarction.  The ventricles and sulci are appropriate for the patient's age. The basal cisterns are patent.  Visualized portions of the orbits are unremarkable. The visualized portions of the paranasal sinuses and mastoid air cells are  unremarkable.  The osseous structures are unremarkable.  CT CERVICAL SPINE FINDINGS  The alignment is anatomic. The vertebral body heights are maintained. There is no acute fracture. There is no static listhesis. The prevertebral soft tissues are normal. The intraspinal soft tissues are not fully imaged on this examination due to poor soft tissue contrast, but there is no gross soft tissue abnormality.  The disc spaces are maintained.  The visualized portions of the lung apices demonstrate no focal abnormality.  IMPRESSION: 1. No acute intracranial pathology. 2. No acute osseous injury of the cervical spine.   Electronically Signed   By: Elige KoHetal  Patel   On: 03/09/2013 17:04   Labs Review Labs Reviewed - No data to display Imaging Review Ct Head Wo Contrast  03/09/2013   CLINICAL DATA:  INJURY TO THE BACK OF THE HEAD YESTERDAY. BRIEF LOSS OF CONSCIOUSNESS.  EXAM: CT HEAD WITHOUT CONTRAST  CT CERVICAL SPINE WITHOUT CONTRAST  TECHNIQUE: Multidetector CT imaging of the head and cervical spine was performed following the standard protocol without intravenous contrast. Multiplanar CT image reconstructions of the cervical spine were also generated.  COMPARISON:  MR HEAD WO/W CM dated 02/12/2011  FINDINGS: CT HEAD FINDINGS  There is no evidence of mass effect, midline shift or extra-axial fluid collections. There is no evidence of a space-occupying lesion or intracranial hemorrhage. There is no evidence of a cortical-based area of acute infarction.  The ventricles and sulci are appropriate for the patient's age. The basal cisterns are patent.  Visualized portions of the orbits are unremarkable. The visualized portions of the paranasal sinuses and mastoid air cells are unremarkable.  The osseous structures are unremarkable.  CT CERVICAL SPINE FINDINGS  The alignment is anatomic. The vertebral body heights are maintained. There is no acute fracture. There is no static listhesis. The prevertebral soft tissues are normal. The  intraspinal soft tissues are not fully imaged on this examination due to poor soft tissue contrast, but there is no gross soft tissue abnormality.  The disc spaces are maintained.  The visualized portions of the lung apices demonstrate no focal abnormality.  IMPRESSION: 1. No acute intracranial pathology. 2. No acute osseous injury of the cervical spine.   Electronically Signed   By: Elige KoHetal  Patel   On: 03/09/2013 17:04   Ct Cervical Spine Wo Contrast  03/09/2013   CLINICAL DATA:  INJURY TO THE BACK OF THE HEAD YESTERDAY. BRIEF LOSS OF CONSCIOUSNESS.  EXAM: CT HEAD WITHOUT CONTRAST  CT CERVICAL SPINE WITHOUT CONTRAST  TECHNIQUE: Multidetector CT imaging of the head and cervical spine was performed following the standard protocol without intravenous contrast. Multiplanar CT image reconstructions of  the cervical spine were also generated.  COMPARISON:  MR HEAD WO/W CM dated 02/12/2011  FINDINGS: CT HEAD FINDINGS  There is no evidence of mass effect, midline shift or extra-axial fluid collections. There is no evidence of a space-occupying lesion or intracranial hemorrhage. There is no evidence of a cortical-based area of acute infarction.  The ventricles and sulci are appropriate for the patient's age. The basal cisterns are patent.  Visualized portions of the orbits are unremarkable. The visualized portions of the paranasal sinuses and mastoid air cells are unremarkable.  The osseous structures are unremarkable.  CT CERVICAL SPINE FINDINGS  The alignment is anatomic. The vertebral body heights are maintained. There is no acute fracture. There is no static listhesis. The prevertebral soft tissues are normal. The intraspinal soft tissues are not fully imaged on this examination due to poor soft tissue contrast, but there is no gross soft tissue abnormality.  The disc spaces are maintained.  The visualized portions of the lung apices demonstrate no focal abnormality.  IMPRESSION: 1. No acute intracranial pathology. 2. No  acute osseous injury of the cervical spine.   Electronically Signed   By: Elige Ko   On: 03/09/2013 17:04     EKG Interpretation None      MDM   Final diagnoses:  Headache  Head injury    Filed Vitals:   03/09/13 1320 03/09/13 1758  BP: 138/96 130/80  Pulse: 70 78  Temp: 98.6 F (37 C) 98.1 F (36.7 C)  TempSrc: Oral Oral  Resp: 16 16  Height: 5\' 9"  (1.753 m)   Weight: 169 lb (76.658 kg)   SpO2: 96% 100%   I personally performed the services described in this documentation, which was scribed in my presence. The recorded information has been reviewed and is accurate.  Patient presenting to the ED with injury to the head that occurred yesterday while patient was placing groceries in her car - stated that the window in the back of her car reported that the window fell and landed on her head. Patient reported that she "blacked out" for a few seconds, but when she came to she was alert and knew what was going on around her. Stated that she has been having head discomfort to the vertex of her head. Stated that she has been having intermittent tinnitus yesterday and today. Reported that she has been feeling mildly nausea, but denied vomiting. Reported that she has been feeling tired all day and reported that she forgot her password at work. Denied chest pain, shortness of breath, difficulty breathing, blurred vision, sudden loss of vision, syncope, numbness, tingling, weakness. Alert and oriented. GCS 15. Heart rate and rhythm normal. Lungs clear to auscultation to upper and lower lobes bilaterally. Radial and DP pulses 2+ bilaterally. Negative facial trauma noted. Negative palpation to hematomas. Discomfort upon palpation to the vertex of the scalp, rightward deviation. Negative pain upon palpation to the c-spine. Full ROM to upper and lower extremities bilaterally without difficulty or ataxia. Strength intact to upper and lower extremities bilaterally with equal distribution. Sensation  intact to upper and lower extremities with differentiation to sharp and dull touch. Cranial nerves grossly intact. Gait proper, negative sway or poor balance. Negative Romberg sign. Patient is able to walk heel to toe and tippy toes. Finger to nose bilaterally without difficulty or ataxia. Heel to knee down shin bilaterally without difficulty or ataxia.  CT head without contrast negative acute intracranial abnormalities noted. CT cervical spine negative for acute fracture  or injury. Negative visual distortions.  Doubt concussion. Doubt stroke. Doubt ICH. Doubt SAH. Suspicion to be mild head contusion secondary to head injury. Patient neurologically intact - negative focal neurological deficits noted. Patient stable, afebrile. Discharged patient. Referred to PCP and neurology. Discussed with patient to rest and stay hydrated. Discharged patient with zofran. Discussed with patient to closely monitor symptoms and if symptoms are to worsen or change to report back to the ED - strict return instructions given.  Patient agreed to plan of care, understood, all questions answered.   Raymon Mutton, PA-C 03/10/13 1404

## 2013-03-11 NOTE — ED Provider Notes (Signed)
Medical screening examination/treatment/procedure(s) were performed by non-physician practitioner and as supervising physician I was immediately available for consultation/collaboration.   EKG Interpretation None        Joya Gaskinsonald W [place] Trawick, MD 03/11/13 Barry Brunner1935

## 2013-05-22 ENCOUNTER — Other Ambulatory Visit: Payer: Self-pay | Admitting: Obstetrics and Gynecology

## 2013-05-22 ENCOUNTER — Other Ambulatory Visit (HOSPITAL_COMMUNITY)
Admission: RE | Admit: 2013-05-22 | Discharge: 2013-05-22 | Disposition: A | Discharge status: Home / Self Care | Payer: BC Managed Care – PPO | Source: Ambulatory Visit | Attending: Obstetrics and Gynecology | Admitting: Obstetrics and Gynecology

## 2013-05-22 DIAGNOSIS — Z01419 Encounter for gynecological examination (general) (routine) without abnormal findings: Secondary | ICD-10-CM | POA: Insufficient documentation

## 2013-05-26 ENCOUNTER — Other Ambulatory Visit: Payer: Self-pay | Admitting: Family Medicine

## 2013-05-26 ENCOUNTER — Ambulatory Visit
Admission: RE | Admit: 2013-05-26 | Discharge: 2013-05-26 | Disposition: A | Discharge status: Home / Self Care | Payer: BC Managed Care – PPO | Source: Ambulatory Visit | Attending: Family Medicine | Admitting: Family Medicine

## 2013-05-26 ENCOUNTER — Encounter (INDEPENDENT_AMBULATORY_CARE_PROVIDER_SITE_OTHER): Payer: Self-pay

## 2013-05-26 DIAGNOSIS — M546 Pain in thoracic spine: Secondary | ICD-10-CM

## 2014-02-26 ENCOUNTER — Other Ambulatory Visit: Payer: Self-pay

## 2014-02-26 DIAGNOSIS — Z1231 Encounter for screening mammogram for malignant neoplasm of breast: Secondary | ICD-10-CM

## 2014-03-13 ENCOUNTER — Ambulatory Visit
Admission: RE | Admit: 2014-03-13 | Discharge: 2014-03-13 | Disposition: A | Discharge status: Home / Self Care | Payer: BLUE CROSS/BLUE SHIELD | Source: Ambulatory Visit

## 2014-03-13 DIAGNOSIS — Z1231 Encounter for screening mammogram for malignant neoplasm of breast: Secondary | ICD-10-CM

## 2014-05-28 ENCOUNTER — Other Ambulatory Visit (HOSPITAL_COMMUNITY)
Admission: RE | Admit: 2014-05-28 | Discharge: 2014-05-28 | Disposition: A | Discharge status: Home / Self Care | Payer: BLUE CROSS/BLUE SHIELD | Source: Ambulatory Visit | Attending: Obstetrics and Gynecology | Admitting: Obstetrics and Gynecology

## 2014-05-28 ENCOUNTER — Other Ambulatory Visit: Payer: Self-pay | Admitting: Obstetrics and Gynecology

## 2014-05-28 DIAGNOSIS — Z01419 Encounter for gynecological examination (general) (routine) without abnormal findings: Secondary | ICD-10-CM | POA: Diagnosis not present

## 2014-05-31 LAB — CYTOLOGY - PAP

## 2015-05-28 ENCOUNTER — Other Ambulatory Visit: Payer: Self-pay | Admitting: Obstetrics and Gynecology

## 2015-05-28 ENCOUNTER — Other Ambulatory Visit (HOSPITAL_COMMUNITY)
Admission: RE | Admit: 2015-05-28 | Discharge: 2015-05-28 | Disposition: A | Discharge status: Home / Self Care | Payer: BLUE CROSS/BLUE SHIELD | Source: Ambulatory Visit | Attending: Obstetrics and Gynecology | Admitting: Obstetrics and Gynecology

## 2015-05-28 DIAGNOSIS — Z01419 Encounter for gynecological examination (general) (routine) without abnormal findings: Secondary | ICD-10-CM | POA: Diagnosis present

## 2015-05-28 DIAGNOSIS — Z1151 Encounter for screening for human papillomavirus (HPV): Secondary | ICD-10-CM | POA: Diagnosis present

## 2015-05-31 LAB — CYTOLOGY - PAP

## 2015-10-15 ENCOUNTER — Other Ambulatory Visit: Payer: Self-pay | Admitting: Obstetrics and Gynecology

## 2015-10-15 DIAGNOSIS — Z1231 Encounter for screening mammogram for malignant neoplasm of breast: Secondary | ICD-10-CM

## 2015-11-04 ENCOUNTER — Ambulatory Visit
Admission: RE | Admit: 2015-11-04 | Discharge: 2015-11-04 | Disposition: A | Discharge status: Home / Self Care | Payer: BLUE CROSS/BLUE SHIELD | Source: Ambulatory Visit | Attending: Obstetrics and Gynecology | Admitting: Obstetrics and Gynecology

## 2015-11-04 DIAGNOSIS — Z1231 Encounter for screening mammogram for malignant neoplasm of breast: Secondary | ICD-10-CM

## 2016-09-02 ENCOUNTER — Ambulatory Visit (INDEPENDENT_AMBULATORY_CARE_PROVIDER_SITE_OTHER): Payer: Self-pay | Admitting: Orthopaedic Surgery

## 2016-10-08 ENCOUNTER — Ambulatory Visit
Admission: EM | Admit: 2016-10-08 | Discharge: 2016-10-08 | Disposition: A | Discharge status: Home / Self Care | Payer: BLUE CROSS/BLUE SHIELD | Attending: Family Medicine | Admitting: Family Medicine

## 2016-10-08 ENCOUNTER — Encounter: Payer: Self-pay | Admitting: *Deleted

## 2016-10-08 DIAGNOSIS — R05 Cough: Secondary | ICD-10-CM | POA: Diagnosis not present

## 2016-10-08 DIAGNOSIS — J069 Acute upper respiratory infection, unspecified: Secondary | ICD-10-CM | POA: Diagnosis not present

## 2016-10-08 HISTORY — DX: Essential (primary) hypertension: I10

## 2016-10-08 LAB — RAPID STREP SCREEN (MED CTR MEBANE ONLY): Streptococcus, Group A Screen (Direct): NEGATIVE

## 2016-10-08 MED ORDER — BENZONATATE 200 MG PO CAPS: ORAL_CAPSULE | ORAL | 0 refills | Status: DC

## 2016-10-08 MED ORDER — FLUTICASONE PROPIONATE 50 MCG/ACT NA SUSP: 2.0000 | Freq: Every day | NASAL | 0 refills | Status: DC

## 2016-10-08 MED ORDER — HYDROCOD POLST-CPM POLST ER 10-8 MG/5ML PO SUER: 5.0000 mL | Freq: Two times a day (BID) | ORAL | 0 refills | Status: DC

## 2016-10-08 NOTE — ED Triage Notes (Signed)
Sore throat and cough for 4 days

## 2016-10-08 NOTE — ED Provider Notes (Signed)
[facility]    CSN: [id number] Arrival date & time: 10/08/16  0813     History   Chief Complaint Chief Complaint  Patient presents with  . Cough  . Sore Throat    HPI Rachel Huynh is a 41 y.o. female.   HPI  This a 41 year old female who presents with a four-day history of sore throat cough and sinus congestion. Denies any fever or chills. O2 sats are 98%. She does not smoke. She's not had any shortness of breath. She has mild production of the cough yellow to green sputum.        Past Medical History:  Diagnosis Date  . Hypertension     There are no active problems to display for this patient.   Past Surgical History:  Procedure Laterality Date  . ANTERIOR CRUCIATE LIGAMENT REPAIR    . DENTAL SURGERY    . TONSILLECTOMY      OB History    No data available       Home Medications    Prior to Admission medications   Medication Sig Start Date End Date Taking? Authorizing Provider  norethindrone-ethinyl estradiol (JUNEL FE,GILDESS FE,LOESTRIN FE) 1-20 MG-MCG tablet Take 1 tablet by mouth daily.   Yes [provider]  valsartan-hydrochlorothiazide (DIOVAN-HCT) 160-25 MG tablet Take 1 tablet by mouth daily.   Yes [provider]  benzonatate (TESSALON) 200 MG capsule Take one cap TID PRN cough 10/08/16   Lutricia Feil, PA-C  chlorpheniramine-HYDROcodone [facility]) 10-8 MG/5ML SUER Take 5 mLs by mouth 2 (two) times daily. 10/08/16   Lutricia Feil, PA-C  fluticasone (FLONASE) 50 MCG/ACT nasal spray Place 2 sprays into both nostrils daily. 10/08/16   Lutricia Feil, PA-C  norgestrel-ethinyl estradiol (LOW-OGESTREL) 0.3-30 MG-MCG tablet Take 1 tablet by mouth daily.    [provider]    Family History Family History  Problem Relation Age of Onset  . Diabetes Mother   . COPD Mother     Social History Social History  Substance Use Topics  . Smoking status: Never Smoker  . Smokeless tobacco:  Never Used  . Alcohol use Yes     Comment: social     Allergies   Neosporin [neomycin-bacitracin zn-polymyx]; Zithromax [azithromycin]; and Penicillins   Review of Systems Review of Systems  Constitutional: Positive for activity change. Negative for appetite change, fatigue and fever.  HENT: Positive for congestion, sinus pain and sinus pressure.   Respiratory: Positive for cough. Negative for shortness of breath.   All other systems reviewed and are negative.    Physical Exam Triage Vital Signs ED Triage Vitals  Enc Vitals Group     BP 10/08/16 0841 134/87     Pulse --      Resp 10/08/16 0841 12     Temp 10/08/16 0841 98.7 F (37.1 C)     Temp Source 10/08/16 0841 Oral     SpO2 10/08/16 0841 98 %     Weight 10/08/16 0835 240 lb (108.9 kg)     Height 10/08/16 0835  (1.727 m)     Head Circumference --      Peak Flow --      Pain Score 10/08/16 0835 6     Pain Loc --      Pain Edu? --      Excl. in GC? --    No data found.   Updated Vital Signs BP 134/87 (BP Location: Left Arm)   Temp  98.7 F (37.1 C) (Oral)   Resp 12   Ht  (1.727 m)   Wt 240 lb (108.9 kg)   LMP 09/14/2016   SpO2 98%   BMI 36.49 kg/m   Visual Acuity Right Eye Distance:   Left Eye Distance:   Bilateral Distance:    Right Eye Near:   Left Eye Near:    Bilateral Near:     Physical Exam  Constitutional: She is oriented to person, place, and time. She appears well-developed and well-nourished. No distress.  HENT:  Head: Normocephalic.  Nose: Nose normal.  Mouth/Throat: Oropharynx is clear and moist. No oropharyngeal exudate.  Both TMs appear dark.  Eyes: Pupils are equal, round, and reactive to light. Right eye exhibits no discharge. Left eye exhibits no discharge.  Neck: Normal range of motion.  Pulmonary/Chest: Effort normal and breath sounds normal.  Musculoskeletal: Normal range of motion.  Neurological: She is alert and oriented to person, place, and time.  Skin: Skin  is warm and dry. She is not diaphoretic.  Psychiatric: She has a normal mood and affect. Her behavior is normal. Judgment and thought content normal.  Nursing note and vitals reviewed.    UC Treatments / Results  Labs (all labs ordered are listed, but only abnormal results are displayed) Labs Reviewed  RAPID STREP SCREEN (NOT AT [facility])  CULTURE, GROUP A STREP [facility])    EKG  EKG Interpretation None       Radiology No results found.  Procedures Procedures (including critical care time)  Medications Ordered in UC Medications - No data to display   Initial Impression / Assessment and Plan / UC Course  I have reviewed the triage vital signs and the nursing notes.  Pertinent labs & imaging results that were available during my care of the patient were reviewed by me and considered in my medical decision making (see chart for details).     Plan: 1. Test/x-ray results and diagnosis reviewed with patient 2. rx as per orders; risks, benefits, potential side effects reviewed with patient 3. Recommend supportive treatment with Rest and fluids. Use Tylenol or Motrin for aches pains or fever. Told patient this is of likely a viral illness does not require antibiotics at this time. However if it continues or worsens over the next week or 2 of return to work clinic or go to her primary care physician. 4. F/u prn if symptoms worsen or don't improve   Final Clinical Impressions(s) / UC Diagnoses   Final diagnoses:  Upper respiratory tract infection, unspecified type    New Prescriptions Discharge Medication List as of 10/08/2016  9:10 AM    START taking these medications   Details  benzonatate (TESSALON) 200 MG capsule Take one cap TID PRN cough, Normal    chlorpheniramine-HYDROcodone (TUSSIONEX PENNKINETIC ER) 10-8 MG/5ML SUER Take 5 mLs by mouth 2 (two) times daily., Starting Thu 10/08/2016, Print    fluticasone (FLONASE) 50 MCG/ACT nasal spray Place 2 sprays into both  nostrils daily., Starting Thu 10/08/2016, Normal         Controlled Substance Prescriptions [place] Controlled Substance Registry consulted? Not Applicable   Lutricia Feil, PA-C 10/08/16 1116

## 2016-10-11 LAB — CULTURE, GROUP A STREP (THRC)

## 2017-01-08 ENCOUNTER — Other Ambulatory Visit: Payer: Self-pay | Admitting: Obstetrics and Gynecology

## 2017-01-08 DIAGNOSIS — Z1231 Encounter for screening mammogram for malignant neoplasm of breast: Secondary | ICD-10-CM

## 2017-01-12 ENCOUNTER — Ambulatory Visit
Admission: RE | Admit: 2017-01-12 | Discharge: 2017-01-12 | Disposition: A | Discharge status: Home / Self Care | Payer: BLUE CROSS/BLUE SHIELD | Source: Ambulatory Visit | Attending: Obstetrics and Gynecology | Admitting: Obstetrics and Gynecology

## 2017-01-12 DIAGNOSIS — Z1231 Encounter for screening mammogram for malignant neoplasm of breast: Secondary | ICD-10-CM

## 2017-02-01 ENCOUNTER — Ambulatory Visit: Payer: BLUE CROSS/BLUE SHIELD

## 2018-06-13 ENCOUNTER — Other Ambulatory Visit (HOSPITAL_COMMUNITY)
Admission: RE | Admit: 2018-06-13 | Discharge: 2018-06-13 | Disposition: A | Discharge status: Home / Self Care | Payer: Commercial Managed Care - PPO | Source: Ambulatory Visit | Attending: Obstetrics and Gynecology | Admitting: Obstetrics and Gynecology

## 2018-06-13 ENCOUNTER — Other Ambulatory Visit: Payer: Self-pay | Admitting: Obstetrics and Gynecology

## 2018-06-13 DIAGNOSIS — Z01419 Encounter for gynecological examination (general) (routine) without abnormal findings: Secondary | ICD-10-CM | POA: Insufficient documentation

## 2018-06-29 LAB — CYTOLOGY - PAP
Diagnosis: NEGATIVE
HPV: NOT DETECTED

## 2018-07-08 ENCOUNTER — Encounter

## 2018-07-08 ENCOUNTER — Ambulatory Visit
Admission: EM | Admit: 2018-07-08 | Discharge: 2018-07-08 | Disposition: A | Discharge status: Home / Self Care | Payer: Commercial Managed Care - PPO | Attending: Family Medicine | Admitting: Family Medicine

## 2018-07-08 ENCOUNTER — Other Ambulatory Visit: Payer: Self-pay

## 2018-07-08 ENCOUNTER — Encounter: Payer: Self-pay | Admitting: Emergency Medicine

## 2018-07-08 ENCOUNTER — Ambulatory Visit (INDEPENDENT_AMBULATORY_CARE_PROVIDER_SITE_OTHER): Payer: Commercial Managed Care - PPO

## 2018-07-08 DIAGNOSIS — R51 Headache: Secondary | ICD-10-CM

## 2018-07-08 DIAGNOSIS — G44201 Tension-type headache, unspecified, intractable: Secondary | ICD-10-CM | POA: Diagnosis not present

## 2018-07-08 DIAGNOSIS — J0101 Acute recurrent maxillary sinusitis: Secondary | ICD-10-CM | POA: Diagnosis not present

## 2018-07-08 DIAGNOSIS — J0130 Acute sphenoidal sinusitis, unspecified: Secondary | ICD-10-CM

## 2018-07-08 DIAGNOSIS — R519 Headache, unspecified: Secondary | ICD-10-CM

## 2018-07-08 MED ORDER — KETOROLAC TROMETHAMINE 60 MG/2ML IM SOLN
60.0000 mg | Freq: Once | INTRAMUSCULAR | Status: AC
  Administered 2018-07-08: 11:00:00 60 mg via INTRAMUSCULAR

## 2018-07-08 MED ORDER — DOXYCYCLINE HYCLATE 100 MG PO CAPS: 100.0000 mg | ORAL_CAPSULE | Freq: Two times a day (BID) | ORAL | 0 refills | Status: DC

## 2018-07-08 NOTE — Discharge Instructions (Addendum)
Take medication as prescribed. Rest. Drink plenty of fluids. Monitor closely.   Follow up with your primary care physician this week for close follow up.    Return to Urgent care or emergency room for new or worsening concerns.

## 2018-07-08 NOTE — ED Provider Notes (Signed)
[facility] ____________________________________________  Time seen: Approximately 9:47 AM  I have reviewed the triage vital signs and the nursing notes.   HISTORY  Chief Complaint Headache   HPI Rachel Huynh is aJenetta Huynh 43 y.o. female presenting for evaluation of left-sided frontal headache present for the last 2.5 months.  Patient reports the headache initially was maybe every other day, but states the headache has been gradually increasing in frequency as well as severity.  States the headache is usually there but with varying intensity.  Pain currently 6 out of 10.  States pain is to left forehead area going from behind her left eye to left scalp line.  States she does have intermittent flashes of light to left eye as well as occasional ringing in her left ear when headache increases.  Denies other accompanying symptoms currently.  Denies vomiting.  Does have a history of tension headaches as well as migraines, but states she is not been having migraines recently.  No head injury or trauma recently.  Patient does report that yesterday she noticed a large amount of postnasal drainage but no actual nasal congestion.  No cough, sore throat, fevers, chest pain, shortness of breath or other complaints.  Patient states she is working to follow-up with her primary care doctor next week for the same complaint.  States occasionally takes ibuprofen or Tylenol, but not very often she does not like to take a lot of medications over-the-counter.  Denies any paresthesias, weakness, unilateral weakness, confusion or other headache.  Blair HeysEhinger, Robert, MD: PCP   Past Medical History:  Diagnosis Date  . Hypertension     There are no active problems to display for this patient.   Past Surgical History:  Procedure Laterality Date  . ANTERIOR CRUCIATE LIGAMENT REPAIR    . DENTAL SURGERY    . TONSILLECTOMY       No current facility-administered medications for this encounter.   Current  Outpatient Medications:  .  famotidine (PEPCID) 20 MG tablet, TK 1 T PO BID FOR STOMACH ACID, Disp: , Rfl:  .  norgestrel-ethinyl estradiol (LOW-OGESTREL) 0.3-30 MG-MCG tablet, Take 1 tablet by mouth daily., Disp: , Rfl:  .  valsartan-hydrochlorothiazide (DIOVAN-HCT) 160-25 MG tablet, Take 1 tablet by mouth daily., Disp: , Rfl:  .  benzonatate (TESSALON) 200 MG capsule, Take one cap TID PRN cough, Disp: 30 capsule, Rfl: 0 .  chlorpheniramine-HYDROcodone (TUSSIONEX PENNKINETIC ER) 10-8 MG/5ML SUER, Take 5 mLs by mouth 2 (two) times daily., Disp: 115 mL, Rfl: 0 .  doxycycline (VIBRAMYCIN) 100 MG capsule, Take 1 capsule (100 mg total) by mouth 2 (two) times daily., Disp: 20 capsule, Rfl: 0 .  fluticasone (FLONASE) 50 MCG/ACT nasal spray, Place 2 sprays into both nostrils daily., Disp: 16 g, Rfl: 0 .  norethindrone-ethinyl estradiol (JUNEL FE,GILDESS FE,LOESTRIN FE) 1-20 MG-MCG tablet, Take 1 tablet by mouth daily., Disp: , Rfl:   Allergies Neosporin [neomycin-bacitracin zn-polymyx], Zithromax [azithromycin], and Penicillins  Family History  Problem Relation Age of Onset  . Diabetes Mother   . COPD Mother   . Breast cancer Maternal Grandmother        unsure of age   Brother: optic lymphoma  Social History Social History   Tobacco Use  . Smoking status: Never Smoker  . Smokeless tobacco: Never Used  Substance Use Topics  . Alcohol use: Yes    Comment: social  . Drug use: No    Review of Systems Constitutional: No fever Eyes: No current visual changes. ENT:  No sore throat. Cardiovascular: Denies chest pain. Respiratory: Denies shortness of breath. Gastrointestinal: No abdominal pain.  No nausea, no vomiting. Musculoskeletal: Negative for back pain. Skin: Negative for rash. Neurological: Positive for headaches. Negative focal weakness or numbness.   ____________________________________________   PHYSICAL EXAM:  VITAL SIGNS: ED Triage Vitals  Enc Vitals Group     BP  07/08/18 0921 114/79     Pulse Rate 07/08/18 0921 92     Resp 07/08/18 0921 16     Temp 07/08/18 0921 98 F (36.7 C)     Temp Source 07/08/18 0921 Oral     SpO2 07/08/18 0921 100 %     Weight 07/08/18 0917 250 lb (113.4 kg)     Height 07/08/18 0917 5\' 8"  (1.727 m)     Head Circumference --      Peak Flow --      Pain Score 07/08/18 0916 6     Pain Loc --      Pain Edu? --      Excl. in [place]? --     Constitutional: Alert and oriented. Well appearing and in no acute distress. Eyes: Conjunctivae are normal. PERRL. EOMI. no pain with EOMs. ENT      Head: Normocephalic and atraumatic.  No frontal or maxillary sinus tenderness to palpation.      Ears: Nontender, normal canal, no erythema, normal TM bilaterally.      Nose: No congestion/rhinnorhea.      Mouth/Throat: Mucous membranes are moist.Oropharynx non-erythematous. Neck: No stridor. Supple without meningismus.  Hematological/Lymphatic/Immunilogical: No cervical lymphadenopathy. Cardiovascular: Normal rate, regular rhythm. Grossly normal heart sounds.  Good peripheral circulation. Respiratory: Normal respiratory effort without tachypnea nor retractions. Breath sounds are clear and equal bilaterally. No wheezes, rales, rhonchi. Musculoskeletal: Steady gait. Neurologic:  Normal speech and language. No gross focal neurologic deficits are appreciated. Speech is normal. No gait instability.  Negative Romberg.  Negative pronator drift.  No ataxia. Skin:  Skin is warm, dry and intact. No rash noted. Psychiatric: Mood and affect are normal. Speech and behavior are normal. Patient exhibits appropriate insight and judgment   ___________________________________________   LABS (all labs ordered are listed, but only abnormal results are displayed)  Labs Reviewed - No data to display  RADIOLOGY  Ct Head Wo Contrast  Result Date: 07/08/2018 CLINICAL DATA:  Headaches with left blurry vision. EXAM: CT HEAD WITHOUT CONTRAST TECHNIQUE:  Contiguous axial images were obtained from the base of the skull through the vertex without intravenous contrast. COMPARISON:  March 09, 2013 FINDINGS: Brain: No evidence of acute infarction, hemorrhage, hydrocephalus, extra-axial collection or mass lesion/mass effect. Vascular: No hyperdense vessel is noted. Skull: Normal. Negative for fracture or focal lesion. Sinuses/Orbits: Mucoperiosteal thickening of the sphenoid sinuses are identified. Other: None. IMPRESSION: No focal acute intracranial abnormality identified. Mucoperiosteal thickening of sphenoid sinuses noted. Sinusitis is not excluded. Electronically Signed   By: Sherian ReinWei-Chen  Lin M.D.   On: 07/08/2018 10:49   ____________________________________________   PROCEDURES Procedures     INITIAL IMPRESSION / ASSESSMENT AND PLAN / ED COURSE  Pertinent labs & imaging results that were available during my care of the patient were reviewed by me and considered in my medical decision making (see chart for details).  Well-appearing patient.  No acute distress.  Left frontal headache present for the last 2.5 months and increasing in frequency.  Discussed multiple differentials with patient, recommend further evaluation CT head.  CT head evaluated, results as above per radiologist, no focal acute  intracranial abnormality, possible sphenoid sinusitis.  60 mg IM Toradol given once in urgent care.  Will treat with oral doxycycline.  Encourage supportive care.  Over-the-counter Tylenol ibuprofen as needed.  Follow-up closely with primary care this week also encouraged patient to follow-up with neurology.Discussed indication, risks and benefits of medications with patient.  Discussed follow up with Primary care physician this week. Discussed follow up and return parameters including no resolution or any worsening concerns. Patient verbalized understanding and agreed to plan.   ____________________________________________   FINAL CLINICAL IMPRESSION(S) / ED  DIAGNOSES  Final diagnoses:  Acute intractable headache, unspecified headache type  Acute sphenoidal sinusitis, recurrence not specified     ED Discharge Orders         Ordered    doxycycline (VIBRAMYCIN) 100 MG capsule  2 times daily     07/08/18 1102           Note: This dictation was prepared with Dragon dictation along with smaller phrase technology. Any transcriptional errors that result from this process are unintentional.         Marylene Land, NP 07/08/18 1209

## 2018-07-08 NOTE — ED Triage Notes (Signed)
Patient c/o pain on the left side on the front of her head and behind her left eye.  Patient describes her pain as a throbbing pain.  Patient reports sensitivity to light in her left eye.

## 2018-07-11 NOTE — ED Notes (Signed)
Patient prior authorization not required with this patients health care plan.

## 2018-08-01 ENCOUNTER — Encounter: Payer: Self-pay | Admitting: Emergency Medicine

## 2018-08-01 ENCOUNTER — Ambulatory Visit
Admission: EM | Admit: 2018-08-01 | Discharge: 2018-08-01 | Disposition: A | Discharge status: Home / Self Care | Payer: Commercial Managed Care - PPO | Attending: Family Medicine | Admitting: Family Medicine

## 2018-08-01 ENCOUNTER — Other Ambulatory Visit: Payer: Self-pay

## 2018-08-01 DIAGNOSIS — H6121 Impacted cerumen, right ear: Secondary | ICD-10-CM | POA: Diagnosis not present

## 2018-08-01 NOTE — ED Triage Notes (Signed)
Patient c/o right ear pain that started on Saturday. Patient states she has been using OTC ear drops.

## 2018-08-01 NOTE — ED Provider Notes (Signed)
[facility]    CSN: [id number] Arrival date & time: 08/01/18  1618   History   Chief Complaint Chief Complaint  Patient presents with  . Otalgia   HPI  43 year old female presents with R ear pain.  Patient reports right ear pain started on Saturday.  She felt as if her ear was clogged with wax.  She used peroxide and some over-the-counter eardrops without resolution.  She has tried to irrigate her ear without success.  She does feel like it is slightly better but she still feels that there is fluid or wax in her ear.  No fever.  No drainage from her ears.  She leaves that this may have been exacerbated by what she has done to try to relieve the situation.  Her pain is mild currently at 4/10 in severity.  No other associated symptoms.  No other complaints.  PMH, Surgical Hx, Family Hx, Social History reviewed and updated as below.  Past Medical History:  Diagnosis Date  . Hypertension    Past Surgical History:  Procedure Laterality Date  . ANTERIOR CRUCIATE LIGAMENT REPAIR    . DENTAL SURGERY    . TONSILLECTOMY      OB History   No obstetric history on file.    Home Medications    Prior to Admission medications   Medication Sig Start Date End Date Taking? Authorizing Provider  famotidine (PEPCID) 20 MG tablet TK 1 T PO BID FOR STOMACH ACID 06/16/18  Yes [provider]  norethindrone-ethinyl estradiol (JUNEL FE,GILDESS FE,LOESTRIN FE) 1-20 MG-MCG tablet Take 1 tablet by mouth daily.   Yes [provider]  valsartan-hydrochlorothiazide (DIOVAN-HCT) 160-25 MG tablet Take 1 tablet by mouth daily.   Yes [provider]  fluticasone (FLONASE) 50 MCG/ACT nasal spray Place 2 sprays into both nostrils daily. 10/08/16 08/01/18 Yes Lutricia Feiloemer, William P, PA-C  chlorpheniramine-HYDROcodone (TUSSIONEX PENNKINETIC ER) 10-8 MG/5ML SUER Take 5 mLs by mouth 2 (two) times daily. 10/08/16   Lutricia Feiloemer, William P, PA-C  norgestrel-ethinyl estradiol (LOW-OGESTREL)  0.3-30 MG-MCG tablet Take 1 tablet by mouth daily.  08/01/18  [provider]    Family History Family History  Problem Relation Age of Onset  . Diabetes Mother   . COPD Mother   . Breast cancer Maternal Grandmother        unsure of age    Social History Social History   Tobacco Use  . Smoking status: Never Smoker  . Smokeless tobacco: Never Used  Substance Use Topics  . Alcohol use: Yes    Comment: social  . Drug use: No     Allergies   Neosporin [neomycin-bacitracin zn-polymyx], Zithromax [azithromycin], and Penicillins   Review of Systems Review of Systems  Constitutional: Negative.   HENT: Positive for ear pain.    Physical Exam Triage Vital Signs ED Triage Vitals  Enc Vitals Group     BP 08/01/18 1632 (!) 122/93     Pulse Rate 08/01/18 1632 (!) 101     Resp 08/01/18 1632 18     Temp 08/01/18 1632 98.2 F (36.8 C)     Temp Source 08/01/18 1632 Oral     SpO2 08/01/18 1632 100 %     Weight 08/01/18 1632 250 lb (113.4 kg)     Height 08/01/18 1632 5\' 8"  (1.727 m)     Head Circumference --      Peak Flow --      Pain Score 08/01/18 1631 4  Pain Loc --      Pain Edu? --      Excl. in [place]? --    Updated Vital Signs BP (!) 122/93 (BP Location: Right Arm)   Pulse (!) 101   Temp 98.2 F (36.8 C) (Oral)   Resp 18   Ht 5\' 8"  (1.727 m)   Wt 113.4 kg   LMP 07/26/2018   SpO2 100%   BMI 38.01 kg/m   Visual Acuity Right Eye Distance:   Left Eye Distance:   Bilateral Distance:    Right Eye Near:   Left Eye Near:    Bilateral Near:     Physical Exam Vitals signs and nursing note reviewed.  Constitutional:      General: She is not in acute distress.    Appearance: Normal appearance. She is obese.  HENT:     Head: Normocephalic and atraumatic.     Right Ear: There is impacted cerumen.     Nose: Nose normal.  Eyes:     General:        Right eye: No discharge.        Left eye: No discharge.     Conjunctiva/sclera: Conjunctivae normal.   Cardiovascular:     Rate and Rhythm: Normal rate and regular rhythm.  Pulmonary:     Effort: Pulmonary effort is normal. No respiratory distress.  Neurological:     Mental Status: She is alert.  Psychiatric:        Mood and Affect: Mood normal.        Behavior: Behavior normal.    UC Treatments / Results  Labs (all labs ordered are listed, but only abnormal results are displayed) Labs Reviewed - No data to display  EKG   Radiology No results found.  Procedures Procedures (including critical care time)  Medications Ordered in UC Medications - No data to display  Initial Impression / Assessment and Plan / UC Course  I have reviewed the triage vital signs and the nursing notes.  Pertinent labs & imaging results that were available during my care of the patient were reviewed by me and considered in my medical decision making (see chart for details).    43 year old female presents with cerumen impaction.  Irrigation performed today with success.    Final Clinical Impressions(s) / UC Diagnoses   Final diagnoses:  Impacted cerumen of right ear   Discharge Instructions   None    ED Prescriptions    None     Controlled Substance Prescriptions [place] Controlled Substance Registry consulted? Not Applicable   Coral Spikes, DO 08/01/18 1737

## 2018-09-16 ENCOUNTER — Encounter: Payer: Self-pay | Admitting: Emergency Medicine

## 2018-09-16 ENCOUNTER — Other Ambulatory Visit: Payer: Self-pay

## 2018-09-16 ENCOUNTER — Ambulatory Visit
Admission: EM | Admit: 2018-09-16 | Discharge: 2018-09-16 | Disposition: A | Discharge status: Home / Self Care | Payer: Commercial Managed Care - PPO | Attending: Urgent Care | Admitting: Urgent Care

## 2018-09-16 DIAGNOSIS — B9789 Other viral agents as the cause of diseases classified elsewhere: Secondary | ICD-10-CM | POA: Diagnosis not present

## 2018-09-16 DIAGNOSIS — J069 Acute upper respiratory infection, unspecified: Secondary | ICD-10-CM

## 2018-09-16 MED ORDER — HYDROCOD POLST-CPM POLST ER 10-8 MG/5ML PO SUER: 5.0000 mL | Freq: Two times a day (BID) | ORAL | 0 refills | Status: DC

## 2018-09-16 MED ORDER — PREDNISONE 20 MG PO TABS: 40.0000 mg | ORAL_TABLET | Freq: Every day | ORAL | 0 refills | Status: DC

## 2018-09-16 NOTE — ED Triage Notes (Signed)
Patient refused COVID-19 testing.  

## 2018-09-16 NOTE — ED Triage Notes (Signed)
Patient in today c/o non-productive cough x 3 days. Patient denies fever or sob. Patient does states she has a sore throat, but think this is due to coughing.

## 2018-09-16 NOTE — Discharge Instructions (Signed)
It was very nice seeing you today in clinic. Thank you for entrusting me with your care.   Rest and increase fluid intake as much as possible. Please utilize the medications that we discussed. Your prescriptions have been called in to your pharmacy. May use Tylenol and/or Ibuprofen as needed for discomfort.  Make arrangements to follow up with your regular doctor in 1 week for re-evaluation if not improving. If your symptoms/condition worsens, please seek follow up care either here or in the ER. Please remember, our [place] providers are "right here with you" when you need [place].   Again, it was my pleasure to take care of you today. Thank you for choosing our clinic. I hope that you start to feel better quickly.   Honor Loh, MSN, APRN, FNP-C, CEN Advanced Practice Provider [facility]

## 2018-09-16 NOTE — ED Provider Notes (Addendum)
[place]   Name: Rachel Huynh DOB: [birth year] MRN: [id number] CSN: [id number] PCP: Gaynelle Arabian, MD  Arrival date and time:  09/16/18 1556  Chief Complaint:  Cough (APPT)   NOTE: Prior to seeing the patient today, I have reviewed the triage nursing documentation and vital signs. Clinical staff has updated patient's PMH/PSHx, current medication list, and drug allergies/intolerances to ensure comprehensive history available to assist in medical decision making.   History:   HPI: Rachel Huynh is a 43 y.o. female who presents today with complaints of a dry cough that has worsened over the course of the last 3 days. Patient states, "I get this same cough every year in September or October". Cough reported to be worse at night, which in turn has prevented her from sleeping properly since the onset of her symptoms. Patient denies any associated fevers, sore throat, or ear pain. She complains of her chest being tight. She denies nausea, vomiting, and diarrhea. She is eating and drinking well. Patient denies close contact with anyone known to be ill. She works from home and has not been out in public without proper face covering. Patient refuses to be tested for SARS-CoV-2 (novel coronavirus) today citing that this is "the same stuff that she gets every year". In efforts to conservatively manage her symptoms at home, the patient notes that she has used Dayquil, Nyquil, and Delsym, which have not helped to improve her symptoms. She also has taken some left over benzonatate, however notes that this intervention was ineffective as well.   Past Medical History:  Diagnosis Date  . Hypertension     Past Surgical History:  Procedure Laterality Date  . ANTERIOR CRUCIATE LIGAMENT REPAIR    . DENTAL SURGERY    . TONSILLECTOMY      Family History  Problem Relation Age of Onset  . Diabetes Mother   . COPD Mother   . Other Father        auto accident  . Breast cancer Maternal Grandmother    unsure of age    Social History   Tobacco Use  . Smoking status: Never Smoker  . Smokeless tobacco: Never Used  Substance Use Topics  . Alcohol use: Yes    Comment: social  . Drug use: No    There are no active problems to display for this patient.   Home Medications:    Current Meds  Medication Sig  . famotidine (PEPCID) 20 MG tablet TK 1 T PO BID FOR STOMACH ACID  . norethindrone-ethinyl estradiol (JUNEL FE,GILDESS FE,LOESTRIN FE) 1-20 MG-MCG tablet Take 1 tablet by mouth daily.  . valsartan-hydrochlorothiazide (DIOVAN-HCT) 160-25 MG tablet Take 1 tablet by mouth daily.    Allergies:   Neosporin [neomycin-bacitracin zn-polymyx], Zithromax [azithromycin], and Penicillins  Review of Systems (ROS): Review of Systems  Constitutional: Negative for fatigue and fever.  HENT: Negative for congestion, ear pain, postnasal drip, rhinorrhea, sinus pressure, sinus pain, sneezing and sore throat.   Eyes: Negative for pain, discharge and redness.  Respiratory: Positive for cough and chest tightness. Negative for shortness of breath.   Cardiovascular: Negative for chest pain and palpitations.  Gastrointestinal: Negative for abdominal pain, diarrhea, nausea and vomiting.  Musculoskeletal: Negative for arthralgias, back pain, myalgias and neck pain.  Skin: Negative for color change, pallor and rash.  Neurological: Negative for dizziness, syncope, weakness and headaches.  Hematological: Negative for adenopathy.  Psychiatric/Behavioral: Positive for sleep disturbance (2/2 acute illness).     Vital Signs: Today's Vitals  09/16/18 1617 09/16/18 1618 09/16/18 1644  BP: (!) 130/91    Pulse: 99    Resp: 18    Temp: 98.6 F (37 C)    TempSrc: Oral    SpO2: 100%    Weight:  250 lb (113.4 kg)   Height:  5\' 8"  (1.727 m)   PainSc:   0-No pain    Physical Exam: Physical Exam  Constitutional: She is oriented to person, place, and time and well-developed, well-nourished, and in no  distress. She has a sickly appearance (acutely ill appearing). No distress.  HENT:  Head: Normocephalic and atraumatic.  Right Ear: Tympanic membrane normal.  Left Ear: Tympanic membrane normal.  Nose: Nose normal. No mucosal edema, rhinorrhea or sinus tenderness.  Mouth/Throat: Uvula is midline. Posterior oropharyngeal erythema present.  Eyes: Pupils are equal, round, and reactive to light. Conjunctivae and EOM are normal.  Neck: Normal range of motion. Neck supple. No tracheal deviation present.  Cardiovascular: Normal rate, regular rhythm, normal heart sounds and intact distal pulses. Exam reveals no gallop and no friction rub.  No murmur heard. Pulmonary/Chest: Effort normal. No accessory muscle usage. No respiratory distress. She has no decreased breath sounds. She has wheezes (scattered expiratory). She has no rales.  Abdominal: Soft. Normal appearance and bowel sounds are normal. She exhibits no distension. There is no hepatosplenomegaly. There is no abdominal tenderness.  Musculoskeletal: Normal range of motion.  Lymphadenopathy:    She has no cervical adenopathy.  Neurological: She is alert and oriented to person, place, and time. Gait normal. GCS score is 15.  Skin: Skin is warm and dry. No rash noted. She is not diaphoretic.  Psychiatric: Mood, memory, affect and judgment normal.  Nursing note and vitals reviewed.   Urgent Care Treatments / Results:   LABS: PLEASE NOTE: all labs that were ordered this encounter are listed, however only abnormal results are displayed. Labs Reviewed - No data to display  EKG: -None  RADIOLOGY: No results found.  PROCEDURES: Procedures  MEDICATIONS RECEIVED THIS VISIT: Medications - No data to display  PERTINENT CLINICAL COURSE NOTES/UPDATES:   Initial Impression / Assessment and Plan / Urgent Care Course:  Pertinent labs & imaging results that were available during my care of the patient were personally reviewed by me and  considered in my medical decision making (see lab/imaging section of note for values and interpretations).  Rachel Huynh is a 43 y.o. female who presents to [facility] today with complaints of Cough (APPT)   Patient is acutely ill appearing (non-toxic) in clinic today. She does not appear to be in any acute distress. Presenting symptoms (see HPI) and exam as documented above. She has a significant cough that is non-productive. Cough preventing her from sleeping at night. Will defer imaging of the chest at this point as patient has no fevers and her SPO2 is 100% on RA. Slight expiratory wheezing. Patient refuses SARS-CoV-2 (novel coronavirus) testing. Will treat for a viral URI with Tussionex and short steroid burst. Dicussed supportive care measures at home during acute phase of illness. Patient to rest as much as possible. She was encouraged to ensure adequate hydration (water and ORS) to prevent dehydration and electrolyte derangements. Patient may use APAP and/or IBU on an as needed basis for pain/fever.   Discussed follow up with primary care physician in 1 week for re-evaluation. I have reviewed the follow up and strict return precautions for any new or worsening symptoms. Patient is aware of symptoms that would  be deemed urgent/emergent, and would thus require further evaluation either here or in the emergency department. At the time of discharge, she verbalized understanding and consent with the discharge plan as it was reviewed with her. All questions were fielded by provider and/or clinic staff prior to patient discharge.    Final Clinical Impressions / Urgent Care Diagnoses:   Final diagnoses:  Viral URI with cough    New Prescriptions:  [place] Controlled Substance Registry consulted? Yes, I have consulted the [place] Controlled Substances Registry for this patient, and feel the risk/benefit ratio today is favorable for proceeding with this prescription for a controlled substance.  .  Discussed use of controlled substance medication to treat her acute symptoms.  o Reviewed [place] STOP Act regulations  o Clinic does not refill controlled substances over the phone without face to face evaluation.  . Safety precautions reviewed.  o Medications should not be shared, taken with alcohol, or used outside of the prescribed parameters.  o Avoid use while working, driving, or operating heavy machinery.  o Side effects associated with the use of this particular medication reviewed. - Patient understands that this medication can cause CNS depression, increase her risk of falls, and even lead to overdose that may result in death, if used outside of the parameters that she and I discussed.  With all of this in mind, she knowingly accepts the risks and responsibilities associated with intended course of treatment, and elects to responsibly proceed as discussed.  Meds ordered this encounter  Medications  . predniSONE (DELTASONE) 20 MG tablet    Sig: Take 2 tablets (40 mg total) by mouth daily.    Dispense:  6 tablet    Refill:  0  . chlorpheniramine-HYDROcodone (TUSSIONEX PENNKINETIC ER) 10-8 MG/5ML SUER    Sig: Take 5 mLs by mouth 2 (two) times daily.    Dispense:  115 mL    Refill:  0    Recommended Follow up Care:  Patient encouraged to follow up with the following provider within the specified time frame, or sooner as dictated by the severity of her symptoms. As always, she was instructed that for any urgent/emergent care needs, she should seek care either here or in the emergency department for more immediate evaluation.  Follow-up Information    Blair Heys, MD In 1 week.   Specialty: Family Medicine Why: General reassessment of symptoms if not improving Contact information: [address] [place] [address] [phone]         NOTE: This note was prepared using Dragon dictation software along with smaller phrase technology. Despite my best ability to  proofread, there is the potential that transcriptional errors may still occur from this process, and are completely unintentional.     Verlee Monte, NP 09/16/18 2352

## 2019-01-27 ENCOUNTER — Ambulatory Visit
Admission: EM | Admit: 2019-01-27 | Discharge: 2019-01-27 | Disposition: A | Discharge status: Home / Self Care | Payer: Commercial Managed Care - PPO | Attending: Family Medicine | Admitting: Family Medicine

## 2019-01-27 ENCOUNTER — Encounter: Payer: Self-pay | Admitting: Emergency Medicine

## 2019-01-27 ENCOUNTER — Ambulatory Visit (INDEPENDENT_AMBULATORY_CARE_PROVIDER_SITE_OTHER): Payer: Commercial Managed Care - PPO

## 2019-01-27 ENCOUNTER — Other Ambulatory Visit: Payer: Self-pay

## 2019-01-27 DIAGNOSIS — R05 Cough: Secondary | ICD-10-CM

## 2019-01-27 DIAGNOSIS — R509 Fever, unspecified: Secondary | ICD-10-CM | POA: Insufficient documentation

## 2019-01-27 DIAGNOSIS — Z20822 Contact with and (suspected) exposure to covid-19: Secondary | ICD-10-CM | POA: Diagnosis present

## 2019-01-27 DIAGNOSIS — R8290 Unspecified abnormal findings in urine: Secondary | ICD-10-CM | POA: Diagnosis present

## 2019-01-27 LAB — URINALYSIS, COMPLETE (UACMP) WITH MICROSCOPIC
Glucose, UA: NEGATIVE mg/dL
Nitrite: POSITIVE — AB
Protein, ur: 100 mg/dL — AB
Specific Gravity, Urine: 1.025 (ref 1.005–1.030)
pH: 6 (ref 5.0–8.0)

## 2019-01-27 LAB — INFLUENZA PANEL BY PCR (TYPE A & B)
Influenza A By PCR: NEGATIVE
Influenza B By PCR: NEGATIVE

## 2019-01-27 LAB — SARS CORONAVIRUS 2 AG (30 MIN TAT): SARS Coronavirus 2 Ag: NEGATIVE

## 2019-01-27 MED ORDER — CIPROFLOXACIN HCL 500 MG PO TABS: 500.0000 mg | ORAL_TABLET | Freq: Two times a day (BID) | ORAL | 0 refills | Status: DC

## 2019-01-27 MED ORDER — TRAMADOL HCL 50 MG PO TABS: 50.0000 mg | ORAL_TABLET | Freq: Three times a day (TID) | ORAL | 0 refills | Status: DC | PRN

## 2019-01-27 MED ORDER — FLUCONAZOLE 150 MG PO TABS: 150.0000 mg | ORAL_TABLET | Freq: Once | ORAL | 0 refills | Status: AC

## 2019-01-27 MED ORDER — ONDANSETRON HCL 4 MG PO TABS: 4.0000 mg | ORAL_TABLET | Freq: Three times a day (TID) | ORAL | 0 refills | Status: DC | PRN

## 2019-01-27 MED ORDER — BENZONATATE 200 MG PO CAPS: 200.0000 mg | ORAL_CAPSULE | Freq: Three times a day (TID) | ORAL | 0 refills | Status: DC | PRN

## 2019-01-27 NOTE — ED Provider Notes (Signed)
[facility]    CSN: [id number] Arrival date & time: 01/27/19  1536  History   Chief Complaint Chief Complaint  Patient presents with  . Cough  . Fever  . Generalized Body Aches   HPI   44 year old female presents with the above complaints.  Patient states that she has been sick since Wednesday.  She reports fever, chills, body aches, headache, cough, congestion.  Denies shortness of breath.  He states that her body aches are severe, 10/10 in severity.  No relieving factors.  She has had no recent sick contacts.  Her husband has had Covid back in December but he has fully recovered.  No known exacerbating factors.  Additionally, patient reports that she has recently had strong smelling urine. Has also been experiencing nausea.  No other associated symptoms.  No other complaints or concerns at this time.  Past Medical History:  Diagnosis Date  . Hypertension    Past Surgical History:  Procedure Laterality Date  . ANTERIOR CRUCIATE LIGAMENT REPAIR    . DENTAL SURGERY    . TONSILLECTOMY     OB History   No obstetric history on file.    Home Medications    Prior to Admission medications   Medication Sig Start Date End Date Taking? Authorizing Provider  famotidine (PEPCID) 20 MG tablet TK 1 T PO BID FOR STOMACH ACID 06/16/18  Yes [provider]  norethindrone-ethinyl estradiol (JUNEL FE,GILDESS FE,LOESTRIN FE) 1-20 MG-MCG tablet Take 1 tablet by mouth daily.   Yes [provider]  valsartan-hydrochlorothiazide (DIOVAN-HCT) 160-25 MG tablet Take 1 tablet by mouth daily.   Yes [provider]  benzonatate (TESSALON) 200 MG capsule Take 1 capsule (200 mg total) by mouth 3 (three) times daily as needed for cough. 01/27/19   Tommie Sams, DO  ciprofloxacin (CIPRO) 500 MG tablet Take 1 tablet (500 mg total) by mouth 2 (two) times daily. 01/27/19   Tommie Sams, DO  fluconazole (DIFLUCAN) 150 MG tablet Take 1 tablet (150 mg total) by mouth once for  1 dose. Repeat dose in 72 hours. 01/27/19 01/27/19  Tommie Sams, DO  ondansetron (ZOFRAN) 4 MG tablet Take 1 tablet (4 mg total) by mouth every 8 (eight) hours as needed for nausea or vomiting. 01/27/19   Tommie Sams, DO  traMADol (ULTRAM) 50 MG tablet Take 1 tablet (50 mg total) by mouth every 8 (eight) hours as needed for moderate pain or severe pain. 01/27/19   Tommie Sams, DO  fluticasone (FLONASE) 50 MCG/ACT nasal spray Place 2 sprays into both nostrils daily. 10/08/16 08/01/18  Lutricia Feil, PA-C  norgestrel-ethinyl estradiol (LOW-OGESTREL) 0.3-30 MG-MCG tablet Take 1 tablet by mouth daily.  08/01/18  [provider]    Family History Family History  Problem Relation Age of Onset  . Diabetes Mother   . COPD Mother   . Other Father        auto accident  . Breast cancer Maternal Grandmother        unsure of age    Social History Social History   Tobacco Use  . Smoking status: Never Smoker  . Smokeless tobacco: Never Used  Substance Use Topics  . Alcohol use: Yes    Comment: social  . Drug use: No     Allergies   Neosporin [neomycin-bacitracin zn-polymyx], Zithromax [azithromycin], and Penicillins   Review of Systems Review of Systems  Constitutional: Positive for chills and fever.  HENT: Positive for congestion.  Respiratory: Positive for cough.   Musculoskeletal:       Body aches.  Neurological: Positive for headaches.    Physical Exam Triage Vital Signs ED Triage Vitals  Enc Vitals Group     BP 01/27/19 1552 118/72     Pulse Rate 01/27/19 1552 (!) 140     Resp 01/27/19 1552 16     Temp 01/27/19 1552 (!) 100.4 F (38 C)     Temp Source 01/27/19 1552 Oral     SpO2 01/27/19 1552 99 %     Weight 01/27/19 1549 250 lb (113.4 kg)     Height 01/27/19 1549 5\' 9"  (1.753 m)     Head Circumference --      Peak Flow --      Pain Score 01/27/19 1549 10     Pain Loc --      Pain Edu? --      Excl. in GC? --    Updated Vital Signs BP 118/72 (BP  Location: Left Arm)   Pulse (!) 140   Temp (!) 100.4 F (38 C) (Oral)   Resp 16   Ht 5\' 9"  (1.753 m)   Wt 113.4 kg   LMP 01/06/2019 (Approximate)   SpO2 99%   BMI 36.92 kg/m   Visual Acuity Right Eye Distance:   Left Eye Distance:   Bilateral Distance:    Right Eye Near:   Left Eye Near:    Bilateral Near:     Physical Exam Constitutional:      General: She is not in acute distress.    Comments: Appears mildly ill.  HENT:     Head: Normocephalic and atraumatic.  Eyes:     General:        Right eye: No discharge.        Left eye: No discharge.     Conjunctiva/sclera: Conjunctivae normal.  Cardiovascular:     Rate and Rhythm: Regular rhythm. Tachycardia present.     Heart sounds: No murmur.  Pulmonary:     Effort: Pulmonary effort is normal.     Breath sounds: Normal breath sounds. No wheezing or rales.  Skin:    General: Skin is warm.     Findings: No rash.  Neurological:     Mental Status: She is alert.  Psychiatric:        Mood and Affect: Mood normal.        Behavior: Behavior normal.    UC Treatments / Results  Labs (all labs ordered are listed, but only abnormal results are displayed) Labs Reviewed  URINALYSIS, COMPLETE (UACMP) WITH MICROSCOPIC - Abnormal; Notable for the following components:      Result Value   APPearance HAZY (*)    Hgb urine dipstick MODERATE (*)    Bilirubin Urine SMALL (*)    Ketones, ur TRACE (*)    Protein, ur 100 (*)    Nitrite POSITIVE (*)    Leukocytes,Ua TRACE (*)    Bacteria, UA MANY (*)    All other components within normal limits  SARS CORONAVIRUS 2 AG (30 MIN TAT)  NOVEL CORONAVIRUS, NAA (HOSP ORDER, SEND-OUT TO REF LAB; TAT 18-24 HRS)  URINE CULTURE  INFLUENZA PANEL BY PCR (TYPE A & B)    EKG   Radiology DG Chest 2 View  Result Date: 01/27/2019 CLINICAL DATA:  Cough and fever, pending COVID-19 testing EXAM: CHEST - 2 VIEW COMPARISON:  07/19/2011 FINDINGS: Cardiac shadow is at the upper limits of normal in  size. The lungs are well aerated bilaterally. No focal infiltrate or sizable effusion is seen. No bony abnormality is noted. IMPRESSION: No active cardiopulmonary disease. Electronically Signed   By: Inez Catalina M.D.   On: 01/27/2019 16:55    Procedures Procedures (including critical care time)  Medications Ordered in UC Medications - No data to display  Initial Impression / Assessment and Plan / UC Course  I have reviewed the triage vital signs and the nursing notes.  Pertinent labs & imaging results that were available during my care of the patient were reviewed by me and considered in my medical decision making (see chart for details).    44 year old female presents with acute illness with systemic symptoms.  Concern for COVID-19 given respiratory symptoms.  Rapid Covid negative.  Flu negative.  Chest x-ray negative.  Patient reporting malodorous urine.  She reports prior history of UTI.  Unsure if she has any flank pain due to the fact that she has severe and diffuse body aches.  Urinalysis was obtained and revealed moderate hemoglobin, positive nitrite.  Microscopy revealed bacteria, pyuria, hematuria.  Additionally, there were 6-10 squamous epithelial cells.  Concern for pyelonephritis.  Exact etiology of her symptomatology is unclear at this time.  Treating empirically for pyelonephritis with Cipro.  Awaiting culture.  Awaiting Covid test results.  Tramadol as needed for body aches.  Tessalon Perles for cough.  Zofran for nausea.  Final Clinical Impressions(s) / UC Diagnoses   Final diagnoses:  Febrile illness  Encounter for laboratory testing for COVID-19 virus  Malodorous urine     Discharge Instructions     Lots of fluids.  Medications as directed.  If you worsen, go to the hospital.  COVID test result available in 24-48 hours.  Take care  Dr. Lacinda Axon     ED Prescriptions    Medication Sig Dispense Auth. Provider   traMADol (ULTRAM) 50 MG tablet Take 1 tablet (50  mg total) by mouth every 8 (eight) hours as needed for moderate pain or severe pain. 15 tablet Tammala Weider G, DO   ciprofloxacin (CIPRO) 500 MG tablet Take 1 tablet (500 mg total) by mouth 2 (two) times daily. 14 tablet Adler Alton G, DO   benzonatate (TESSALON) 200 MG capsule Take 1 capsule (200 mg total) by mouth 3 (three) times daily as needed for cough. 30 capsule Chalyn Amescua G, DO   fluconazole (DIFLUCAN) 150 MG tablet Take 1 tablet (150 mg total) by mouth once for 1 dose. Repeat dose in 72 hours. 2 tablet Aikam Vinje G, DO   ondansetron (ZOFRAN) 4 MG tablet Take 1 tablet (4 mg total) by mouth every 8 (eight) hours as needed for nausea or vomiting. 20 tablet Thersa Salt G, DO     I have reviewed the PDMP during this encounter.   Coral Spikes, [place] 01/27/19 1752

## 2019-01-27 NOTE — ED Triage Notes (Signed)
Patient c/o bodyaches, HAs, cough, congestion that started on Wed.  Patient reports fevers.

## 2019-01-27 NOTE — Discharge Instructions (Signed)
Lots of fluids.  Medications as directed.  If you worsen, go to the hospital.  COVID test result available in 24-48 hours.  Take care  Dr. Adriana Simas

## 2019-01-28 LAB — NOVEL CORONAVIRUS, NAA (HOSP ORDER, SEND-OUT TO REF LAB; TAT 18-24 HRS): SARS-CoV-2, NAA: NOT DETECTED

## 2019-01-30 LAB — URINE CULTURE: Culture: >=100000 — AB

## 2019-02-08 ENCOUNTER — Other Ambulatory Visit: Payer: Self-pay | Admitting: Obstetrics and Gynecology

## 2019-02-08 DIAGNOSIS — Z1231 Encounter for screening mammogram for malignant neoplasm of breast: Secondary | ICD-10-CM

## 2019-03-08 ENCOUNTER — Ambulatory Visit
Admission: RE | Admit: 2019-03-08 | Discharge: 2019-03-08 | Disposition: A | Discharge status: Home / Self Care | Payer: Commercial Managed Care - PPO | Source: Ambulatory Visit | Attending: Obstetrics and Gynecology | Admitting: Obstetrics and Gynecology

## 2019-03-08 ENCOUNTER — Other Ambulatory Visit: Payer: Self-pay

## 2019-03-08 DIAGNOSIS — Z1231 Encounter for screening mammogram for malignant neoplasm of breast: Secondary | ICD-10-CM

## 2019-03-19 ENCOUNTER — Other Ambulatory Visit: Payer: Self-pay

## 2019-03-19 ENCOUNTER — Ambulatory Visit: Payer: Commercial Managed Care - PPO

## 2019-03-19 ENCOUNTER — Ambulatory Visit
Admission: EM | Admit: 2019-03-19 | Discharge: 2019-03-19 | Disposition: A | Discharge status: Home / Self Care | Payer: Commercial Managed Care - PPO | Attending: Family Medicine | Admitting: Family Medicine

## 2019-03-19 DIAGNOSIS — R05 Cough: Secondary | ICD-10-CM | POA: Diagnosis not present

## 2019-03-19 DIAGNOSIS — S2231XA Fracture of one rib, right side, initial encounter for closed fracture: Secondary | ICD-10-CM

## 2019-03-19 DIAGNOSIS — R0789 Other chest pain: Secondary | ICD-10-CM

## 2019-03-19 DIAGNOSIS — R0781 Pleurodynia: Secondary | ICD-10-CM | POA: Diagnosis not present

## 2019-03-19 MED ORDER — CYCLOBENZAPRINE HCL 10 MG PO TABS: 5.0000 mg | ORAL_TABLET | Freq: Three times a day (TID) | ORAL | 0 refills | Status: DC | PRN

## 2019-03-19 MED ORDER — PREDNISONE 10 MG PO TABS: ORAL_TABLET | ORAL | 0 refills | Status: DC

## 2019-03-19 MED ORDER — HYDROCODONE-ACETAMINOPHEN 5-325 MG PO TABS: 1.0000 | ORAL_TABLET | ORAL | 0 refills | Status: DC | PRN

## 2019-03-19 NOTE — ED Provider Notes (Signed)
[facility]    CSN: [id number] Arrival date & time: 03/19/19  1012      History   Chief Complaint Chief Complaint  Patient presents with  . Muscle Pain    HPI Rachel Huynh is a 44 y.o. female presents to the urgent care facility for evaluation of right-sided chest wall pain and right rib pain along the mid axillary region.  Patient states right behind her right breast she developed sharp pain with taking a deep breath during a coughing episode back in January, this seemed to mostly resolve up until a week and a half ago when she had a mammogram.  Patient states 2 days after her mammogram she was lifting a go-cart and noticed increased pain to the area and since then has been having moderate sharp pain with taking a deep breath and with movement.  Area of pain is tender to touch.  No shortness of breath, coughing.  No recent surgeries or history of blood clots.  She is been taken ibuprofen with little relief.  Pain feels as if it is the same pain that she developed back in January but did improve with time.  HPI  Past Medical History:  Diagnosis Date  . Hypertension     There are no problems to display for this patient.   Past Surgical History:  Procedure Laterality Date  . ANTERIOR CRUCIATE LIGAMENT REPAIR    . DENTAL SURGERY    . TONSILLECTOMY      OB History   No obstetric history on file.      Home Medications    Prior to Admission medications   Medication Sig Start Date End Date Taking? Authorizing Provider  famotidine (PEPCID) 20 MG tablet TK 1 T PO BID FOR STOMACH ACID 06/16/18  Yes [provider]  norethindrone-ethinyl estradiol (JUNEL FE,GILDESS FE,LOESTRIN FE) 1-20 MG-MCG tablet Take 1 tablet by mouth daily.   Yes [provider]  valsartan-hydrochlorothiazide (DIOVAN-HCT) 160-25 MG tablet Take 1 tablet by mouth daily.   Yes [provider]  benzonatate (TESSALON) 200 MG capsule Take 1 capsule (200 mg total) by mouth  3 (three) times daily as needed for cough. 01/27/19   Tommie Sams, DO  ciprofloxacin (CIPRO) 500 MG tablet Take 1 tablet (500 mg total) by mouth 2 (two) times daily. 01/27/19   Tommie Sams, DO  cyclobenzaprine (FLEXERIL) 10 MG tablet Take 0.5-1 tablets (5-10 mg total) by mouth 3 (three) times daily as needed for muscle spasms. 03/19/19   Evon Slack, PA-C  HYDROcodone-acetaminophen (NORCO) 5-325 MG tablet Take 1 tablet by mouth every 4 (four) hours as needed for moderate pain. 03/19/19   Evon Slack, PA-C  ondansetron (ZOFRAN) 4 MG tablet Take 1 tablet (4 mg total) by mouth every 8 (eight) hours as needed for nausea or vomiting. 01/27/19   Tommie Sams, DO  predniSONE (DELTASONE) 10 MG tablet 10 day taper. 5,5,4,4,3,3,2,2,1,1 03/19/19   Evon Slack, PA-C  traMADol (ULTRAM) 50 MG tablet Take 1 tablet (50 mg total) by mouth every 8 (eight) hours as needed for moderate pain or severe pain. 01/27/19   Tommie Sams, DO  fluticasone (FLONASE) 50 MCG/ACT nasal spray Place 2 sprays into both nostrils daily. 10/08/16 08/01/18  Lutricia Feil, PA-C  norgestrel-ethinyl estradiol (LOW-OGESTREL) 0.3-30 MG-MCG tablet Take 1 tablet by mouth daily.  08/01/18  [provider]    Family History Family History  Problem Relation Age of Onset  . Diabetes  Mother   . COPD Mother   . Other Father        auto accident  . Breast cancer Maternal Grandmother        unsure of age    Social History Social History   Tobacco Use  . Smoking status: Never Smoker  . Smokeless tobacco: Never Used  Substance Use Topics  . Alcohol use: Yes    Comment: social  . Drug use: No     Allergies   Neosporin [neomycin-bacitracin zn-polymyx], Zithromax [azithromycin], and Penicillins   Review of Systems Review of Systems  Constitutional: Negative for fever.  Respiratory: Negative for cough and shortness of breath.   Cardiovascular: Positive for chest pain.  Gastrointestinal: Negative for nausea  and vomiting.  Musculoskeletal: Positive for myalgias. Negative for back pain.  Skin: Negative for wound.     Physical Exam Triage Vital Signs ED Triage Vitals  Enc Vitals Group     BP 03/19/19 1027 118/79     Pulse Rate 03/19/19 1027 96     Resp 03/19/19 1027 18     Temp 03/19/19 1027 98.3 F (36.8 C)     Temp Source 03/19/19 1027 Oral     SpO2 03/19/19 1027 100 %     Weight 03/19/19 1025 250 lb (113.4 kg)     Height 03/19/19 1025 5\' 9"  (1.753 m)     Head Circumference --      Peak Flow --      Pain Score 03/19/19 1023 7     Pain Loc --      Pain Edu? --      Excl. in [place]? --    No data found.  Updated Vital Signs BP 118/79 (BP Location: Left Arm)   Pulse 96   Temp 98.3 F (36.8 C) (Oral)   Resp 18   Ht 5\' 9"  (1.753 m)   Wt 250 lb (113.4 kg)   LMP 03/08/2019   SpO2 100%   BMI 36.92 kg/m   Visual Acuity Right Eye Distance:   Left Eye Distance:   Bilateral Distance:    Right Eye Near:   Left Eye Near:    Bilateral Near:     Physical Exam Constitutional:      Appearance: She is well-developed.  HENT:     Head: Normocephalic and atraumatic.  Eyes:     Conjunctiva/sclera: Conjunctivae normal.  Cardiovascular:     Rate and Rhythm: Normal rate.  Pulmonary:     Effort: Pulmonary effort is normal. No respiratory distress.     Breath sounds: Normal breath sounds. No stridor. No wheezing or rales.  Musculoskeletal:        General: Normal range of motion.     Cervical back: Normal range of motion.     Comments: Along the axillary area, anterior axillary line patient has tenderness to palpation as well as some pain just above her right breast along the pectoralis muscle.  She has sharp pain with taking a deep breath.  She has good lung sounds bilaterally with no wheezing rales or rhonchi.  No abnormal swelling bruising or step-off.  She has no tenderness along the cervical thoracic spine with percussion along the spinous process.  Skin:    General: Skin is warm.       Findings: No rash.  Neurological:     General: No focal deficit present.     Mental Status: She is alert and oriented to person, place, and time.  Psychiatric:  Behavior: Behavior normal.        Thought Content: Thought content normal.      UC Treatments / Results  Labs (all labs ordered are listed, but only abnormal results are displayed) Labs Reviewed - No data to display  EKG   Radiology DG Ribs Unilateral W/Chest Right  Result Date: 03/19/2019 CLINICAL DATA:  Pt presents with c/o possible muscle pain behind her right breast. She states she was coughing a lot in January and the pain started then. She recently had a mammogram (normal) and the pain has increased. She does have some discomfort when moving her shoulder and with deep breaths/cough/sneeze. Denies any surgical hx or hx of covid. Non smoker. Hx bronchitis. EXAM: RIGHT RIBS AND CHEST - 3+ VIEW COMPARISON:  01/27/2019 FINDINGS: No fracture or other bone lesions are seen involving the ribs. There is no evidence of pneumothorax or pleural effusion. Both lungs are clear. Heart size and mediastinal contours are within normal limits. IMPRESSION: Negative. Electronically Signed   By: Amie Portland M.D.   On: 03/19/2019 11:04    Procedures Procedures (including critical care time)  Medications Ordered in UC Medications - No data to display  Initial Impression / Assessment and Plan / UC Course  I have reviewed the triage vital signs and the nursing notes.  Pertinent labs & imaging results that were available during my care of the patient were reviewed by me and considered in my medical decision making (see chart for details).     44 year old female with history and physical exam consistent with musculoskeletal chest wall pain.  Right rib x-ray and chest x-ray obtained looking for rib fracture, x-ray read is negative but with closer review there appears to be some anterior rib step-off consistent with nondisplaced  fracture from the right rib where patient is having tenderness to palpation and pain with taking a deep breath.  Patient is given prednisone, Norco, Flexeril.  She is encouraged to not perform any heavy lifting pushing or pulling.  She has no chest pain, shortness of breath, vital signs are stable.  PE score of 0 with no risk factors.  We discussed signs and symptoms to return to clinic for. Final Clinical Impressions(s) / UC Diagnoses   Final diagnoses:  Right-sided chest wall pain  Rib pain on right side  Closed fracture of one rib of right side, initial encounter     Discharge Instructions     Please take medications as prescribed.  Avoid any heavy lifting pushing or pulling.  If any increasing pain, chest pain or shortness of breath, return to the urgent care or emergency department.    ED Prescriptions    Medication Sig Dispense Auth. Provider   HYDROcodone-acetaminophen (NORCO) 5-325 MG tablet Take 1 tablet by mouth every 4 (four) hours as needed for moderate pain. 20 tablet Evon Slack, PA-C   predniSONE (DELTASONE) 10 MG tablet 10 day taper. 5,5,4,4,3,3,2,2,1,1 30 tablet Evon Slack, PA-C   cyclobenzaprine (FLEXERIL) 10 MG tablet Take 0.5-1 tablets (5-10 mg total) by mouth 3 (three) times daily as needed for muscle spasms. 20 tablet Evon Slack, PA-C     I have reviewed the PDMP during this encounter.   Evon Slack, [place] 03/19/19 1129

## 2019-03-19 NOTE — Discharge Instructions (Addendum)
Please take medications as prescribed.  Avoid any heavy lifting pushing or pulling.  If any increasing pain, chest pain or shortness of breath, return to the urgent care or emergency department.

## 2019-03-19 NOTE — ED Triage Notes (Signed)
Pt presents with c/o possible muscle pain behind her right breast. She states she was coughing a lot in January and the pain started then. She recently had a mammogram (normal) and the pain has increased. She does have some discomfort when moving her shoulder and with deep breaths/cough/sneeze.

## 2019-06-16 ENCOUNTER — Other Ambulatory Visit: Payer: Self-pay | Admitting: Family Medicine

## 2019-06-20 ENCOUNTER — Other Ambulatory Visit (HOSPITAL_COMMUNITY): Payer: Self-pay | Admitting: Family Medicine

## 2019-06-20 ENCOUNTER — Other Ambulatory Visit: Payer: Self-pay | Admitting: Family Medicine

## 2019-06-20 DIAGNOSIS — R079 Chest pain, unspecified: Secondary | ICD-10-CM

## 2019-06-23 ENCOUNTER — Other Ambulatory Visit: Payer: Self-pay

## 2019-06-23 ENCOUNTER — Ambulatory Visit
Admission: RE | Admit: 2019-06-23 | Discharge: 2019-06-23 | Disposition: A | Discharge status: Home / Self Care | Payer: Commercial Managed Care - PPO | Source: Ambulatory Visit | Attending: Family Medicine | Admitting: Family Medicine

## 2019-06-23 DIAGNOSIS — R079 Chest pain, unspecified: Secondary | ICD-10-CM | POA: Diagnosis present

## 2019-06-23 MED ORDER — IOHEXOL 300 MG/ML  SOLN
75.0000 mL | Freq: Once | INTRAMUSCULAR | Status: AC | PRN
  Administered 2019-06-23: 75 mL via INTRAVENOUS

## 2019-12-20 ENCOUNTER — Other Ambulatory Visit: Payer: Self-pay

## 2019-12-20 ENCOUNTER — Encounter: Payer: Self-pay | Admitting: Emergency Medicine

## 2019-12-20 ENCOUNTER — Ambulatory Visit
Admission: EM | Admit: 2019-12-20 | Discharge: 2019-12-20 | Disposition: A | Discharge status: Home / Self Care | Payer: Commercial Managed Care - PPO | Attending: Family Medicine | Admitting: Family Medicine

## 2019-12-20 DIAGNOSIS — R350 Frequency of micturition: Secondary | ICD-10-CM

## 2019-12-20 DIAGNOSIS — M545 Low back pain, unspecified: Secondary | ICD-10-CM | POA: Insufficient documentation

## 2019-12-20 LAB — URINALYSIS, COMPLETE (UACMP) WITH MICROSCOPIC
Glucose, UA: NEGATIVE mg/dL
Hgb urine dipstick: NEGATIVE
Nitrite: NEGATIVE
Protein, ur: NEGATIVE mg/dL
RBC / HPF: NONE SEEN RBC/hpf (ref 0–5)
Specific Gravity, Urine: 1.02 (ref 1.005–1.030)
pH: 6 (ref 5.0–8.0)

## 2019-12-20 NOTE — ED Triage Notes (Signed)
Pt c/o right sided lower back pain, urinary frequency, urinary retention. Started a "couple months" ago. Denies fever. She states she has h/o uti and usually presents this way.

## 2019-12-20 NOTE — Discharge Instructions (Signed)

## 2019-12-20 NOTE — ED Provider Notes (Signed)
[facility]    CSN: [id number] Arrival date & time: 12/20/19  0906      History   Chief Complaint Chief Complaint  Patient presents with  . Back Pain    HPI Rachel Huynh is a 44 y.o. female presenting for right-sided lower back pain for the past "couple of months."  Patient states that she feels like she has had increased frequency and has been urinating less than normal.  She denies any dysuria.  Patient does admit to "disc issues" with her back, but says the pain she feels currently is little different.  She did note some radiation of pain down the back of the right leg and into the lower abdomen a few days ago.  Has not taken any over-the-counter medication for symptoms.  Last menstrual period 2 weeks ago.  Denies any vaginal discharge.  Denies any nausea, vomiting, or changes in bowel or bladder control.  No leg weakness or numbness.  No other complaints or concerns.  HPI  Past Medical History:  Diagnosis Date  . Hypertension     There are no problems to display for this patient.   Past Surgical History:  Procedure Laterality Date  . ANTERIOR CRUCIATE LIGAMENT REPAIR    . DENTAL SURGERY    . TONSILLECTOMY      OB History   No obstetric history on file.      Home Medications    Prior to Admission medications   Medication Sig Start Date End Date Taking? Authorizing Provider  famotidine (PEPCID) 20 MG tablet TK 1 T PO BID FOR STOMACH ACID 06/16/18  Yes [provider]  norethindrone-ethinyl estradiol (JUNEL FE,GILDESS FE,LOESTRIN FE) 1-20 MG-MCG tablet Take 1 tablet by mouth daily.   Yes [provider]  valsartan-hydrochlorothiazide (DIOVAN-HCT) 160-25 MG tablet Take 1 tablet by mouth daily.   Yes [provider]  fluticasone (FLONASE) 50 MCG/ACT nasal spray Place 2 sprays into both nostrils daily. 10/08/16 08/01/18  Lutricia Feil, PA-C  norgestrel-ethinyl estradiol (LOW-OGESTREL) 0.3-30 MG-MCG tablet Take 1 tablet by  mouth daily.  08/01/18  [provider]    Family History Family History  Problem Relation Age of Onset  . Diabetes Mother   . COPD Mother   . Other Father        auto accident  . Breast cancer Maternal Grandmother        unsure of age    Social History Social History   Tobacco Use  . Smoking status: Never Smoker  . Smokeless tobacco: Never Used  Vaping Use  . Vaping Use: Never used  Substance Use Topics  . Alcohol use: Yes    Comment: social  . Drug use: No     Allergies   Neosporin [neomycin-bacitracin zn-polymyx], Zithromax [azithromycin], and Penicillins   Review of Systems Review of Systems  Constitutional: Negative for chills and fever.  Gastrointestinal: Positive for abdominal pain. Negative for diarrhea, nausea and vomiting.  Genitourinary: Positive for decreased urine volume and frequency. Negative for difficulty urinating, dysuria, flank pain, hematuria, pelvic pain, urgency, vaginal bleeding, vaginal discharge and vaginal pain.  Musculoskeletal: Positive for back pain.  Skin: Negative for rash.  Neurological: Negative for weakness and numbness.     Physical Exam Triage Vital Signs ED Triage Vitals  Enc Vitals Group     BP 12/20/19 0948 (!) 141/100     Pulse Rate 12/20/19 0948 86     Resp 12/20/19 0948 18     Temp 12/20/19  0948 98.3 F (36.8 C)     Temp Source 12/20/19 0948 Oral     SpO2 12/20/19 0948 100 %     Weight 12/20/19 0946 250 lb (113.4 kg)     Height 12/20/19 0946 5\' 9"  (1.753 m)     Head Circumference --      Peak Flow --      Pain Score 12/20/19 0946 4     Pain Loc --      Pain Edu? --      Excl. in [place]? --    No data found.  Updated Vital Signs BP (!) 141/100 (BP Location: Right Arm)   Pulse 86   Temp 98.3 F (36.8 C) (Oral)   Resp 18   Ht 5\' 9"  (1.753 m)   Wt 250 lb (113.4 kg)   LMP 12/06/2019 (Approximate)   SpO2 100%   BMI 36.92 kg/m       Physical Exam Vitals and nursing note reviewed.   Constitutional:      General: She is not in acute distress.    Appearance: Normal appearance. She is obese. She is not ill-appearing or toxic-appearing.  HENT:     Head: Normocephalic and atraumatic.     Mouth/Throat:     Pharynx: Oropharynx is clear.  Eyes:     General: No scleral icterus.       Right eye: No discharge.        Left eye: No discharge.     Conjunctiva/sclera: Conjunctivae normal.  Cardiovascular:     Rate and Rhythm: Normal rate and regular rhythm.     Heart sounds: Normal heart sounds.  Pulmonary:     Effort: Pulmonary effort is normal. No respiratory distress.     Breath sounds: Normal breath sounds.  Abdominal:     Palpations: Abdomen is soft.     Tenderness: There is no abdominal tenderness. There is no right CVA tenderness or left CVA tenderness.  Musculoskeletal:     Cervical back: Neck supple.     Lumbar back: Tenderness (right paralumbar region) present. No bony tenderness. Normal range of motion. Negative right straight leg raise test and negative left straight leg raise test.  Skin:    General: Skin is dry.  Neurological:     General: No focal deficit present.     Mental Status: She is alert. Mental status is at baseline.     Motor: No weakness.     Gait: Gait normal.  Psychiatric:        Mood and Affect: Mood normal.        Behavior: Behavior normal.        Thought Content: Thought content normal.      UC Treatments / Results  Labs (all labs ordered are listed, but only abnormal results are displayed) Labs Reviewed  URINALYSIS, COMPLETE (UACMP) WITH MICROSCOPIC - Abnormal; Notable for the following components:      Result Value   APPearance HAZY (*)    Bilirubin Urine SMALL (*)    Ketones, ur TRACE (*)    Leukocytes,Ua SMALL (*)    Bacteria, UA MANY (*)    All other components within normal limits  URINE CULTURE    EKG   Radiology No results found.  Procedures Procedures (including critical care time)  Medications Ordered in  UC Medications - No data to display  Initial Impression / Assessment and Plan / UC Course  I have reviewed the triage vital signs and the nursing notes.  Pertinent labs & imaging results that were available during my care of the patient were reviewed by me and considered in my medical decision making (see chart for details).   Urinalysis does not appear to be a clean-catch.  Urinalysis shows small leukocytes, trace ketones, small bili.  Urine will be sent for culture.  Discussed results with patient and advised her this is not likely consistent with urinary tract infection especially since symptoms are mostly of localized right-sided back pain for couple of months.  Advised patient symptoms likely musculoskeletal back pain, especially given her history of "disc issues."  Advised will call if urine culture is positive and treat with antibiotics at that time if necessary.  Advised increased rest and fluids at this time and take ibuprofen and/or Tylenol for pain.  Advised to use warm compresses as well.  Advise follow-up with Ortho if back pain continues.  ED precautions reviewed.   Final Clinical Impressions(s) / UC Diagnoses   Final diagnoses:  Acute right-sided low back pain, unspecified whether sciatica present  Urinary frequency     Discharge Instructions     BACK PAIN: Stressed avoiding painful activities . RICE (REST, ICE, COMPRESSION, ELEVATION) guidelines reviewed. May alternate ice and heat. Consider use of muscle rubs, Salonpas patches, etc. Use medications as directed including muscle relaxers if prescribed. Take anti-inflammatory medications as prescribed or OTC NSAIDs/Tylenol.  F/u with PCP in 7-10 days for reexamination, and please feel free to call or return to the urgent care at any time for any questions or concerns you may have and we will be happy to help you!   BACK PAIN RED FLAGS: If the back pain acutely worsens or there are any red flag symptoms such as numbness/tingling,  leg weakness, saddle anesthesia, or loss of bowel/bladder control, go immediately to the ER. Follow up with [place] as scheduled or sooner if the pain does not begin to resolve or if it worsens before the follow up      ED Prescriptions    None     PDMP not reviewed this encounter.   Shirlee Latch, PA-C 12/20/19 1030

## 2019-12-22 ENCOUNTER — Telehealth (HOSPITAL_COMMUNITY): Payer: Self-pay | Admitting: Emergency Medicine

## 2019-12-22 LAB — URINE CULTURE: Culture: 60000 — AB

## 2019-12-22 MED ORDER — CEPHALEXIN 500 MG PO CAPS: 500.0000 mg | ORAL_CAPSULE | Freq: Two times a day (BID) | ORAL | 0 refills | Status: AC

## 2020-01-26 ENCOUNTER — Ambulatory Visit (INDEPENDENT_AMBULATORY_CARE_PROVIDER_SITE_OTHER): Payer: Commercial Managed Care - PPO

## 2020-01-26 ENCOUNTER — Encounter: Payer: Self-pay | Admitting: Emergency Medicine

## 2020-01-26 ENCOUNTER — Other Ambulatory Visit: Payer: Self-pay

## 2020-01-26 ENCOUNTER — Ambulatory Visit
Admission: EM | Admit: 2020-01-26 | Discharge: 2020-01-26 | Disposition: A | Discharge status: Home / Self Care | Payer: Commercial Managed Care - PPO | Attending: Family Medicine | Admitting: Family Medicine

## 2020-01-26 DIAGNOSIS — R509 Fever, unspecified: Secondary | ICD-10-CM

## 2020-01-26 DIAGNOSIS — U071 COVID-19: Secondary | ICD-10-CM | POA: Diagnosis not present

## 2020-01-26 DIAGNOSIS — R059 Cough, unspecified: Secondary | ICD-10-CM

## 2020-01-26 DIAGNOSIS — Z20822 Contact with and (suspected) exposure to covid-19: Secondary | ICD-10-CM | POA: Diagnosis not present

## 2020-01-26 DIAGNOSIS — B349 Viral infection, unspecified: Secondary | ICD-10-CM | POA: Diagnosis not present

## 2020-01-26 LAB — SARS CORONAVIRUS 2 (TAT 6-24 HRS): SARS Coronavirus 2: POSITIVE — AB

## 2020-01-26 MED ORDER — HYDROCOD POLST-CPM POLST ER 10-8 MG/5ML PO SUER: 5.0000 mL | Freq: Two times a day (BID) | ORAL | 0 refills | Status: AC | PRN

## 2020-01-26 NOTE — Discharge Instructions (Signed)

## 2020-01-26 NOTE — ED Provider Notes (Signed)
[facility]    CSN: [id number] Arrival date & time: 01/26/20  0901      History   Chief Complaint Chief Complaint  Patient presents with  . Cough    HPI Rachel Huynh is a 45 y.o. female presenting for 1 week history of fatigue and cough. She says that she has become very achy over the past 3 days. Patient says husband has COVID 19, but she had a negative home test. Not vaccinated for COVID. Patient had negative COVID home test 4-5 days into illness and she says she was sick before her husband.  Patient says she occasionally gets some pain in her chest when she coughs and feels occasionally short of breath when she gets into a coughing fit.  She denies any congestion or sore throat but does admit to postnasal drainage.  She says she has been taking over-the-counter DayQuil, Tessalon Perles, ibuprofen and Tylenol without any improvement in her symptoms.  Patient states she is prone to bronchitis and is concerned about that.  She has no history of COPD or emphysema.  Past medical history is significant for hypertension.  Patient has no other complaints or concerns.  HPI  Past Medical History:  Diagnosis Date  . Hypertension     There are no problems to display for this patient.   Past Surgical History:  Procedure Laterality Date  . ANTERIOR CRUCIATE LIGAMENT REPAIR    . DENTAL SURGERY    . TONSILLECTOMY      OB History   No obstetric history on file.      Home Medications    Prior to Admission medications   Medication Sig Start Date End Date Taking? Authorizing Provider  chlorpheniramine-HYDROcodone (TUSSIONEX PENNKINETIC ER) 10-8 MG/5ML SUER Take 5 mLs by mouth every 12 (twelve) hours as needed for up to 7 days for cough. 01/26/20 02/02/20 Yes Shirlee Latch, PA-C  famotidine (PEPCID) 20 MG tablet TK 1 T PO BID FOR STOMACH ACID 06/16/18  Yes [provider]  norethindrone-ethinyl estradiol (JUNEL FE,GILDESS FE,LOESTRIN FE) 1-20 MG-MCG tablet Take 1  tablet by mouth daily.   Yes [provider]  valsartan-hydrochlorothiazide (DIOVAN-HCT) 160-25 MG tablet Take 1 tablet by mouth daily.   Yes [provider]  fluticasone (FLONASE) 50 MCG/ACT nasal spray Place 2 sprays into both nostrils daily. 10/08/16 08/01/18  Lutricia Feil, PA-C  norgestrel-ethinyl estradiol (LOW-OGESTREL) 0.3-30 MG-MCG tablet Take 1 tablet by mouth daily.  08/01/18  [provider]    Family History Family History  Problem Relation Age of Onset  . Diabetes Mother   . COPD Mother   . Other Father        auto accident  . Breast cancer Maternal Grandmother        unsure of age    Social History Social History   Tobacco Use  . Smoking status: Never Smoker  . Smokeless tobacco: Never Used  Vaping Use  . Vaping Use: Never used  Substance Use Topics  . Alcohol use: Yes    Comment: social  . Drug use: No     Allergies   Neosporin [neomycin-bacitracin zn-polymyx], Zithromax [azithromycin], and Penicillins   Review of Systems Review of Systems  Constitutional: Positive for fatigue and fever. Negative for chills and diaphoresis.  HENT: Positive for postnasal drip. Negative for congestion, ear pain, rhinorrhea, sinus pressure, sinus pain and sore throat.   Respiratory: Positive for cough. Negative for shortness of breath.   Gastrointestinal: Negative for abdominal  pain, nausea and vomiting.  Musculoskeletal: Positive for myalgias. Negative for arthralgias.  Skin: Negative for rash.  Neurological: Negative for weakness and headaches.  Hematological: Negative for adenopathy.     Physical Exam Triage Vital Signs ED Triage Vitals  Enc Vitals Group     BP 01/26/20 0945 (!) 140/92     Pulse Rate 01/26/20 0945 (!) 120     Resp 01/26/20 0945 18     Temp 01/26/20 0945 99.3 F (37.4 C)     Temp Source 01/26/20 0945 Oral     SpO2 01/26/20 0945 99 %     Weight 01/26/20 0943 250 lb (113.4 kg)     Height 01/26/20 0943 5\' 9"  (1.753  m)     Head Circumference --      Peak Flow --      Pain Score 01/26/20 0943 8     Pain Loc --      Pain Edu? --      Excl. in GC? --    No data found.  Updated Vital Signs BP (!) 140/92 (BP Location: Left Arm)   Pulse (!) 120   Temp 99.3 F (37.4 C) (Oral)   Resp 18   Ht 5\' 9"  (1.753 m)   Wt 250 lb (113.4 kg)   LMP 01/12/2020   SpO2 99%   BMI 36.92 kg/m       Physical Exam Vitals and nursing note reviewed.  Constitutional:      General: She is not in acute distress.    Appearance: Normal appearance. She is not ill-appearing or toxic-appearing.  HENT:     Head: Normocephalic and atraumatic.     Nose: Nose normal.     Mouth/Throat:     Mouth: Mucous membranes are moist.     Pharynx: Oropharynx is clear.  Eyes:     General: No scleral icterus.       Right eye: No discharge.        Left eye: No discharge.     Conjunctiva/sclera: Conjunctivae normal.  Cardiovascular:     Rate and Rhythm: Regular rhythm. Tachycardia present.     Heart sounds: Normal heart sounds.  Pulmonary:     Effort: Pulmonary effort is normal. No respiratory distress.     Breath sounds: Rhonchi (few scattered rhonchi which clear with cough) present. No wheezing or rales.  Musculoskeletal:     Cervical back: Neck supple.  Skin:    General: Skin is dry.  Neurological:     General: No focal deficit present.     Mental Status: She is alert. Mental status is at baseline.     Motor: No weakness.     Gait: Gait normal.  Psychiatric:        Mood and Affect: Mood normal.        Behavior: Behavior normal.        Thought Content: Thought content normal.      UC Treatments / Results  Labs (all labs ordered are listed, but only abnormal results are displayed) Labs Reviewed  SARS CORONAVIRUS 2 (TAT 6-24 HRS)    EKG   Radiology DG Chest 2 View  Result Date: 01/26/2020 CLINICAL DATA:  Cough, fever. EXAM: CHEST - 2 VIEW COMPARISON:  March 14 21. FINDINGS: The heart size and mediastinal  contours are within normal limits. Both lungs are clear. No visible pleural effusions or pneumothorax. No acute osseous abnormality. Prior right rib fractures, better characterized on prior CT chest. IMPRESSION: No active cardiopulmonary  disease. Electronically Signed   By: Feliberto Harts MD   On: 01/26/2020 10:51    Procedures Procedures (including critical care time)  Medications Ordered in UC Medications - No data to display  Initial Impression / Assessment and Plan / UC Course  I have reviewed the triage vital signs and the nursing notes.  Pertinent labs & imaging results that were available during my care of the patient were reviewed by me and considered in my medical decision making (see chart for details).   45 year old female presenting for 1 week history of worsening cough and new onset of body aches and worsening fatigue. Admits positive COVID exposure. Not vaccinated for COVID-19. In the clinic, blood pressure elevated 140/92. Pulse elevated 120 bpm. Patient is afebrile. No acute distress but is ill-appearing. She does have a few scattered rhonchi on auscultation of chest.  Chest x-ray performed which has been started normal limits. Independently viewed by me.   Suspect viral illness, likely COVID-19. Send out COVID testing obtained. Current CDC guidelines, isolation protocol and ED precautions reviewed with patient. Patient reports trying everything for cough without relief. I have sent in Tussionex for her. Controlled substance database reviewed. Advised patient to follow-up with our clinic as needed.   Final Clinical Impressions(s) / UC Diagnoses   Final diagnoses:  Viral illness  Cough  Exposure to COVID-19 virus     Discharge Instructions     You have received COVID testing today either for positive exposure, concerning symptoms that could be related to COVID infection, screening purposes, or re-testing after confirmed positive.  Your test obtained today checks for  active viral infection in the last 1-2 weeks. If your test is negative now, you can still test positive later. So, if you do develop symptoms you should either get re-tested and/or isolate x 5 days and then strict mask use x 5 days (unvaccinated) or mask use x 10 days (vaccinated). Please follow CDC guidelines.  While Rapid antigen tests come back in 15-20 minutes, send out PCR/molecular test results typically come back within 1-3 days. In the mean time, if you are symptomatic, assume this could be a positive test and treat/monitor yourself as if you do have COVID.   We will call with test results if positive. Please download the MyChart app and set up a profile to access test results.   If symptomatic, go home and rest. Push fluids. Take Tylenol as needed for discomfort. Gargle warm salt water. Throat lozenges. Take Mucinex DM or Robitussin for cough. Humidifier in bedroom to ease coughing. Warm showers. Also review the COVID handout for more information.  COVID-19 INFECTION: The incubation period of COVID-19 is approximately 14 days after exposure, with most symptoms developing in roughly 4-5 days. Symptoms may range in severity from mild to critically severe. Roughly 80% of those infected will have mild symptoms. People of any age may become infected with COVID-19 and have the ability to transmit the virus. The most common symptoms include: fever, fatigue, cough, body aches, headaches, sore throat, nasal congestion, shortness of breath, nausea, vomiting, diarrhea, changes in smell and/or taste.    COURSE OF ILLNESS Some patients may begin with mild disease which can progress quickly into critical symptoms. If your symptoms are worsening please call ahead to the Emergency Department and proceed there for further treatment. Recovery time appears to be roughly 1-2 weeks for mild symptoms and 3-6 weeks for severe disease.   GO IMMEDIATELY TO ER FOR FEVER YOU ARE UNABLE TO GET  DOWN WITH TYLENOL, BREATHING  PROBLEMS, CHEST PAIN, FATIGUE, LETHARGY, INABILITY TO EAT OR DRINK, ETC  QUARANTINE AND ISOLATION: To help decrease the spread of COVID-19 please remain isolated if you have COVID infection or are highly suspected to have COVID infection. This means -stay home and isolate to one room in the home if you live with others. Do not share a bed or bathroom with others while ill, sanitize and wipe down all countertops and keep common areas clean and disinfected. Stay home for 5 days. If you have no symptoms or your symptoms are resolving after 5 days, you can leave your house. Continue to wear a mask around others for 5 additional days. If you have been in close contact (within 6 feet) of someone diagnosed with COVID 19, you are advised to quarantine in your home for 14 days as symptoms can develop anywhere from 2-14 days after exposure to the virus. If you develop symptoms, you  must isolate.  Most current guidelines for COVID after exposure -unvaccinated: isolate 5 days and strict mask use x 5 days. Test on day 5 is possible -vaccinated: wear mask x 10 days if symptoms do not develop -You do not necessarily need to be tested for COVID if you have + exposure and  develop symptoms. Just isolate at home x10 days from symptom onset During this global pandemic, CDC advises to practice social distancing, try to stay at least 726ft away from others at all times. Wear a face covering. Wash and sanitize your hands regularly and avoid going anywhere that is not necessary.  KEEP IN MIND THAT THE COVID TEST IS NOT 100% ACCURATE AND YOU SHOULD STILL DO EVERYTHING TO PREVENT POTENTIAL SPREAD OF VIRUS TO OTHERS (WEAR MASK, WEAR GLOVES, WASH HANDS AND SANITIZE REGULARLY). IF INITIAL TEST IS NEGATIVE, THIS MAY NOT MEAN YOU ARE DEFINITELY NEGATIVE. MOST ACCURATE TESTING IS DONE 5-7 DAYS AFTER EXPOSURE.   It is not advised by CDC to get re-tested after receiving a positive COVID test since you can still test positive for weeks  to months after you have already cleared the virus.   *If you have not been vaccinated for COVID, I strongly suggest you consider getting vaccinated as long as there are no contraindications.      ED Prescriptions    Medication Sig Dispense Auth. Provider   chlorpheniramine-HYDROcodone (TUSSIONEX PENNKINETIC ER) 10-8 MG/5ML SUER Take 5 mLs by mouth every 12 (twelve) hours as needed for up to 7 days for cough. 70 mL Shirlee LatchEaves, Lesley B, PA-C     I have reviewed the PDMP during this encounter.   Shirlee Latchaves, Lesley B, PA-C 01/26/20 1116

## 2020-01-26 NOTE — ED Triage Notes (Signed)
Pt c/o cough, body aches, subjective fever. Started about 3 days ago. Her husband tested positive for covid but her homer test was negative.

## 2020-02-19 ENCOUNTER — Ambulatory Visit
Admission: EM | Admit: 2020-02-19 | Discharge: 2020-02-19 | Disposition: A | Discharge status: Home / Self Care | Payer: Commercial Managed Care - PPO | Attending: Physician Assistant | Admitting: Physician Assistant

## 2020-02-19 ENCOUNTER — Ambulatory Visit (INDEPENDENT_AMBULATORY_CARE_PROVIDER_SITE_OTHER): Payer: Commercial Managed Care - PPO

## 2020-02-19 ENCOUNTER — Other Ambulatory Visit: Payer: Self-pay

## 2020-02-19 DIAGNOSIS — R059 Cough, unspecified: Secondary | ICD-10-CM

## 2020-02-19 DIAGNOSIS — Z8616 Personal history of COVID-19: Secondary | ICD-10-CM

## 2020-02-19 DIAGNOSIS — R0781 Pleurodynia: Secondary | ICD-10-CM

## 2020-02-19 DIAGNOSIS — S2341XA Sprain of ribs, initial encounter: Secondary | ICD-10-CM

## 2020-02-19 MED ORDER — HYDROCOD POLST-CPM POLST ER 10-8 MG/5ML PO SUER: 5.0000 mL | Freq: Two times a day (BID) | ORAL | 0 refills | Status: AC | PRN

## 2020-02-19 NOTE — ED Triage Notes (Signed)
Patient states that she was here 3 weeks ago and diagnosed with Covid. States that she has continued to cough. Reports that last night after coughing her right side of her ribs starting hurting worse. States that she is concerned she fractured her ribs from coughing.

## 2020-02-19 NOTE — ED Provider Notes (Signed)
[facility]    CSN: [id number] Arrival date & time: 02/19/20  1330      History   Chief Complaint Chief Complaint  Patient presents with  . Rib Pain  . Cough    HPI Rachel Huynh is a 45 y.o. female presenting for approximately 3-week history of cough.  Patient diagnosed with COVID-19 on 01/26/2020. Patient says that her cough is better than it was at onset, but still has been lingering.  Patient states that she got into a bad coughing fit last night and then suddenly started having really bad right-sided rib pains.  Patient says that the pain has been constant today.  She denies any trauma or injury to the ribs.  Patient is taking all the over-the-counter medication she can think of without relief.  She says the Tussionex did help when she took it.  Patient denies any fever, fatigue, anterior chest pain, back pain, breathing difficulty or wheezing.  Patient says it does hurt the right ribs whenever she does take a deep breath.  Has not gotten any pain relief from OTC meds.  Patient is no history of cardiopulmonary disease.  Medical history of significant for hypertension.  No other complaints or concerns today.  HPI  Past Medical History:  Diagnosis Date  . Hypertension     There are no problems to display for this patient.   Past Surgical History:  Procedure Laterality Date  . ANTERIOR CRUCIATE LIGAMENT REPAIR    . DENTAL SURGERY    . TONSILLECTOMY      OB History   No obstetric history on file.      Home Medications    Prior to Admission medications   Medication Sig Start Date End Date Taking? Authorizing Provider  chlorpheniramine-HYDROcodone (TUSSIONEX PENNKINETIC ER) 10-8 MG/5ML SUER Take 5 mLs by mouth every 12 (twelve) hours as needed for up to 10 days for cough. 02/19/20 02/29/20 Yes Shirlee Latch, PA-C  famotidine (PEPCID) 20 MG tablet TK 1 T PO BID FOR STOMACH ACID 06/16/18  Yes [provider]  norethindrone-ethinyl estradiol (JUNEL  FE,GILDESS FE,LOESTRIN FE) 1-20 MG-MCG tablet Take 1 tablet by mouth daily.   Yes [provider]  valsartan-hydrochlorothiazide (DIOVAN-HCT) 160-25 MG tablet Take 1 tablet by mouth daily.   Yes [provider]  fluticasone (FLONASE) 50 MCG/ACT nasal spray Place 2 sprays into both nostrils daily. 10/08/16 08/01/18  Lutricia Feil, PA-C  norgestrel-ethinyl estradiol (LOW-OGESTREL) 0.3-30 MG-MCG tablet Take 1 tablet by mouth daily.  08/01/18  [provider]    Family History Family History  Problem Relation Age of Onset  . Diabetes Mother   . COPD Mother   . Other Father        auto accident  . Breast cancer Maternal Grandmother        unsure of age    Social History Social History   Tobacco Use  . Smoking status: Never Smoker  . Smokeless tobacco: Never Used  Vaping Use  . Vaping Use: Never used  Substance Use Topics  . Alcohol use: Yes    Comment: social  . Drug use: No     Allergies   Neosporin [neomycin-bacitracin zn-polymyx], Zithromax [azithromycin], and Penicillins   Review of Systems Review of Systems  Constitutional: Negative for chills, diaphoresis, fatigue and fever.  HENT: Negative for congestion, ear pain, rhinorrhea, sinus pressure, sinus pain and sore throat.   Respiratory: Positive for cough. Negative for shortness of breath and wheezing.  Cardiovascular: Positive for chest pain (right rib pain).  Gastrointestinal: Negative for abdominal pain, nausea and vomiting.  Musculoskeletal: Negative for arthralgias and myalgias.  Skin: Negative for rash.  Neurological: Negative for weakness and headaches.  Hematological: Negative for adenopathy.     Physical Exam Triage Vital Signs ED Triage Vitals  Enc Vitals Group     BP 02/19/20 1336 (!) 126/98     Pulse Rate 02/19/20 1336 100     Resp 02/19/20 1336 17     Temp 02/19/20 1336 98.4 F (36.9 C)     Temp Source 02/19/20 1336 Oral     SpO2 02/19/20 1336 100 %     Weight  02/19/20 1334 250 lb (113.4 kg)     Height 02/19/20 1334 5\' 8"  (1.727 m)     Head Circumference --      Peak Flow --      Pain Score 02/19/20 1334 8     Pain Loc --      Pain Edu? --      Excl. in GC? --    No data found.  Updated Vital Signs BP (!) 126/98 (BP Location: Right Arm)   Pulse 100   Temp 98.4 F (36.9 C) (Oral)   Resp 17   Ht 5\' 8"  (1.727 m)   Wt 250 lb (113.4 kg)   LMP 02/05/2020   SpO2 100%   BMI 38.01 kg/m    Physical Exam Vitals and nursing note reviewed.  Constitutional:      General: She is not in acute distress.    Appearance: Normal appearance. She is not ill-appearing or toxic-appearing.     Comments: Multiple episodes of coughing in exam room  HENT:     Head: Normocephalic and atraumatic.     Nose: Rhinorrhea (trace clear drainage) present.     Mouth/Throat:     Mouth: Mucous membranes are moist.     Pharynx: Oropharynx is clear.  Eyes:     General: No scleral icterus.       Right eye: No discharge.        Left eye: No discharge.     Conjunctiva/sclera: Conjunctivae normal.  Cardiovascular:     Rate and Rhythm: Normal rate and regular rhythm.     Heart sounds: Normal heart sounds.  Pulmonary:     Effort: Pulmonary effort is normal. No respiratory distress.     Breath sounds: Normal breath sounds. No wheezing, rhonchi or rales.  Chest:     Chest wall: Tenderness (moderate TTP lateral ribs 6-8 ) present.  Musculoskeletal:     Cervical back: Neck supple.  Skin:    General: Skin is dry.  Neurological:     General: No focal deficit present.     Mental Status: She is alert. Mental status is at baseline.     Motor: No weakness.     Gait: Gait normal.  Psychiatric:        Mood and Affect: Mood normal.        Behavior: Behavior normal.        Thought Content: Thought content normal.      UC Treatments / Results  Labs (all labs ordered are listed, but only abnormal results are displayed) Labs Reviewed - No data to  display  EKG   Radiology DG Ribs Unilateral W/Chest Right  Result Date: 02/19/2020 CLINICAL DATA:  Cough, COVID positive on 01/26/2020, right rib pain. EXAM: RIGHT RIBS AND CHEST - 3+ VIEW COMPARISON:  Chest radiograph  01/26/2020 and CT chest 08/23/2019. FINDINGS: Frontal view of the chest shows midline trachea and normal heart size. Lungs are clear. No pleural fluid. Osseous structures appear intact. IMPRESSION: No acute findings.  No fracture. Electronically Signed   By: Leanna Battles M.D.   On: 02/19/2020 14:05    Procedures Procedures (including critical care time)  Medications Ordered in UC Medications - No data to display  Initial Impression / Assessment and Plan / UC Course  I have reviewed the triage vital signs and the nursing notes.  Pertinent labs & imaging results that were available during my care of the patient were reviewed by me and considered in my medical decision making (see chart for details).   1) Right-sided rib pain: Patient concerned for possible rib fracture.  Patient admitting to constant coughing since Covid diagnosis 3 weeks ago.  Exam reveals tenderness to palpation along lateral ribs 6 through 8.  Chest x-ray performed and independently reviewed by me.  Right rib and chest x-ray does not show any evidence of fracture or pneumonia or other acute cardiopulmonary abnormality.  Suspect sprain of ribs. Advised supportive care with RICE taken Tylenol for pain relief.  2) Cough: Continued cough related to COVID-19.  No evidence of pneumonia on chest x-ray.  Advised continue supportive care with increasing rest and fluids.  Advised humidifier in room.  Have written prescription for Tussionex for next 10 days to be used sparingly as needed for significant coughing.  Controlled substance database reviewed.  Advised her to follow-up with our department if she develops any worsening symptoms including fever, worsening cough, chest pain or breathing difficulty.  Patient  agreeable.   Final Clinical Impressions(s) / UC Diagnoses   Final diagnoses:  Sprain of costal cartilage, initial encounter  Cough  Personal history of COVID-19     Discharge Instructions     X-rays are normal today.  No signs of rib fracture or pneumonia or other acute cardiopulmonary condition.  You likely sprained your ribs.  You can ice the area and take Tylenol for pain relief.  Take the Tussionex as needed for cough over the next few days.  Increase rest and fluids.  Consider humidifier in room at night.  Follow-up with our department, ED or PCP for any worsening symptoms including fever, increased fatigue, worsening cough, chest pain or breathing difficulty.  Go to ED for severe acute worsening of symptoms.  You should start to feel even better over the next 1 to 2 weeks.    ED Prescriptions    Medication Sig Dispense Auth. Provider   chlorpheniramine-HYDROcodone (TUSSIONEX PENNKINETIC ER) 10-8 MG/5ML SUER Take 5 mLs by mouth every 12 (twelve) hours as needed for up to 10 days for cough. 100 mL Shirlee Latch, PA-C     I have reviewed the PDMP during this encounter.   Shirlee Latch, PA-C 02/19/20 1419

## 2020-02-19 NOTE — Discharge Instructions (Addendum)
X-rays are normal today.  No signs of rib fracture or pneumonia or other acute cardiopulmonary condition.  You likely sprained your ribs.  You can ice the area and take Tylenol for pain relief.  Take the Tussionex as needed for cough over the next few days.  Increase rest and fluids.  Consider humidifier in room at night.  Follow-up with our department, ED or PCP for any worsening symptoms including fever, increased fatigue, worsening cough, chest pain or breathing difficulty.  Go to ED for severe acute worsening of symptoms.  You should start to feel even better over the next 1 to 2 weeks.

## 2020-03-03 ENCOUNTER — Ambulatory Visit
Admission: EM | Admit: 2020-03-03 | Discharge: 2020-03-03 | Disposition: A | Discharge status: Home / Self Care | Payer: Commercial Managed Care - PPO | Attending: Family Medicine | Admitting: Family Medicine

## 2020-03-03 ENCOUNTER — Other Ambulatory Visit: Payer: Self-pay

## 2020-03-03 DIAGNOSIS — R0781 Pleurodynia: Secondary | ICD-10-CM

## 2020-03-03 MED ORDER — HYDROCODONE-ACETAMINOPHEN 5-325 MG PO TABS: 1.0000 | ORAL_TABLET | Freq: Three times a day (TID) | ORAL | 0 refills | Status: DC | PRN

## 2020-03-03 NOTE — ED Provider Notes (Signed)
[facility]    CSN: [id number] Arrival date & time: 03/03/20  0920      History   Chief Complaint Chief Complaint  Patient presents with  . Chest Pain    Rib Pain (right side)     HPI  45 year old female presents with the above complaint.  Patient reports ongoing right-sided rib pain.  Recently seen on 2/14.  X-ray at that time was negative.  Patient continues to have right-sided, lateral rib pain just below the right breast.  She reports tenderness to palpation.  Worse with deep breathing and activity.  She has taken Tylenol and ibuprofen without relief.  Pain is 10/10 in severity.  Recent cough has improved.  No other complaints.  Past Medical History:  Diagnosis Date  . Hypertension    Past Surgical History:  Procedure Laterality Date  . ANTERIOR CRUCIATE LIGAMENT REPAIR    . DENTAL SURGERY    . TONSILLECTOMY      OB History   No obstetric history on file.      Home Medications    Prior to Admission medications   Medication Sig Start Date End Date Taking? Authorizing Provider  HYDROcodone-acetaminophen (NORCO/VICODIN) 5-325 MG tablet Take 1 tablet by mouth every 8 (eight) hours as needed for moderate pain or severe pain. 03/03/20  Yes Ulas Zuercher G, DO  famotidine (PEPCID) 20 MG tablet TK 1 T PO BID FOR STOMACH ACID 06/16/18   [provider]  norethindrone-ethinyl estradiol (JUNEL FE,GILDESS FE,LOESTRIN FE) 1-20 MG-MCG tablet Take 1 tablet by mouth daily.    [provider]  valsartan-hydrochlorothiazide (DIOVAN-HCT) 160-25 MG tablet Take 1 tablet by mouth daily.    [provider]  fluticasone (FLONASE) 50 MCG/ACT nasal spray Place 2 sprays into both nostrils daily. 10/08/16 08/01/18  Lutricia Feil, PA-C  norgestrel-ethinyl estradiol (LOW-OGESTREL) 0.3-30 MG-MCG tablet Take 1 tablet by mouth daily.  08/01/18  [provider]    Family History Family History  Problem Relation Age of Onset  . Diabetes Mother   .  COPD Mother   . Other Father        auto accident  . Breast cancer Maternal Grandmother        unsure of age    Social History Social History   Tobacco Use  . Smoking status: Never Smoker  . Smokeless tobacco: Never Used  Vaping Use  . Vaping Use: Never used  Substance Use Topics  . Alcohol use: Yes    Comment: social  . Drug use: No     Allergies   Neosporin [neomycin-bacitracin zn-polymyx], Zithromax [azithromycin], and Penicillins   Review of Systems Review of Systems  Constitutional: Negative.   Musculoskeletal:       Rib pain (right).   Physical Exam Triage Vital Signs ED Triage Vitals  Enc Vitals Group     BP 03/03/20 0930 139/75     Pulse Rate 03/03/20 0930 88     Resp 03/03/20 0930 16     Temp 03/03/20 0930 98.5 F (36.9 C)     Temp Source 03/03/20 0930 Oral     SpO2 03/03/20 0930 100 %     Weight --      Height --      Head Circumference --      Peak Flow --      Pain Score 03/03/20 0933 10     Pain Loc --      Pain Edu? --      Excl.  in GC? --    Updated Vital Signs BP 139/75 (BP Location: Right Arm)   Pulse 88   Temp 98.5 F (36.9 C) (Oral)   Resp 16   LMP 02/05/2020   SpO2 100%   Visual Acuity Right Eye Distance:   Left Eye Distance:   Bilateral Distance:    Right Eye Near:   Left Eye Near:    Bilateral Near:     Physical Exam Constitutional:      General: She is not in acute distress.    Appearance: She is obese.  HENT:     Head: Normocephalic and atraumatic.  Eyes:     General:        Right eye: No discharge.        Left eye: No discharge.     Conjunctiva/sclera: Conjunctivae normal.  Cardiovascular:     Rate and Rhythm: Normal rate and regular rhythm.     Heart sounds: No murmur heard.   Pulmonary:     Effort: Pulmonary effort is normal.     Breath sounds: Normal breath sounds. No wheezing or rales.  Chest:       Comments: Tenderness at the labeled location. Neurological:     Mental Status: She is alert.   Psychiatric:        Mood and Affect: Mood normal.        Behavior: Behavior normal.    UC Treatments / Results  Labs (all labs ordered are listed, but only abnormal results are displayed) Labs Reviewed - No data to display  EKG   Radiology No results found.  Procedures Procedures (including critical care time)  Medications Ordered in UC Medications - No data to display  Initial Impression / Assessment and Plan / UC Course  I have reviewed the triage vital signs and the nursing notes.  Pertinent labs & imaging results that were available during my care of the patient were reviewed by me and considered in my medical decision making (see chart for details).    45 year old female presents with rib pain.  Decided against reimaging as it would not change management.  Vicodin as needed for pain.  Advised heat.  Supportive care.  Final Clinical Impressions(s) / UC Diagnoses   Final diagnoses:  Rib pain on right side     Discharge Instructions     Heat.  Medication as prescribed.  If you worsen, please let us know.  Take care  Dr. Adriana Simas    ED Prescriptions    Medication Sig Dispense Auth. Provider   HYDROcodone-acetaminophen (NORCO/VICODIN) 5-325 MG tablet Take 1 tablet by mouth every 8 (eight) hours as needed for moderate pain or severe pain. 15 tablet Everlene Other G, DO     I have reviewed the PDMP during this encounter.   Tommie Sams, DO 03/03/20 1000

## 2020-03-03 NOTE — ED Triage Notes (Signed)
Pt here for a follow-up appt for 02/14 visit. She reports she continues to exp right rib discomfort that increases with deep breathing or discomfort. Treating discomfort with tylenol and ibuprofen.   Denies fever.

## 2020-03-03 NOTE — Discharge Instructions (Signed)
Heat.  Medication as prescribed.  If you worsen, please let us know.  Take care  Dr. Adriana Simas

## 2020-07-09 ENCOUNTER — Telehealth: Payer: Commercial Managed Care - PPO | Admitting: Physician Assistant

## 2020-07-09 DIAGNOSIS — L089 Local infection of the skin and subcutaneous tissue, unspecified: Secondary | ICD-10-CM

## 2020-07-09 DIAGNOSIS — W57XXXA Bitten or stung by nonvenomous insect and other nonvenomous arthropods, initial encounter: Secondary | ICD-10-CM

## 2020-07-09 MED ORDER — HYDROXYZINE PAMOATE 25 MG PO CAPS: 25.0000 mg | ORAL_CAPSULE | Freq: Three times a day (TID) | ORAL | 0 refills | Status: DC | PRN

## 2020-07-09 MED ORDER — SULFAMETHOXAZOLE-TRIMETHOPRIM 800-160 MG PO TABS: 1.0000 | ORAL_TABLET | Freq: Two times a day (BID) | ORAL | 0 refills | Status: DC

## 2020-07-09 NOTE — Progress Notes (Signed)
E Visit for Cellulitis  We are sorry that you are not feeling well. Here is how we plan to help!  Based on what you shared with me it looks like you have cellulitis secondary to the fire ant bites  Cellulitis looks like areas of skin redness, swelling, and warmth; it develops as a result of bacteria entering under the skin. Little red spots and/or bleeding can be seen in skin, and tiny surface sacs containing fluid can occur. Fever can be present. Cellulitis is almost always on one side of a body, and the lower limbs are the most common site of involvement.   I have prescribed:  Bactrim DS 1 tablet by mouth twice a day for 7 days  HOME CARE:  Take your medications as ordered and take all of them, even if the skin irritation appears to be healing.   GET HELP RIGHT AWAY IF:  Symptoms that don't begin to go away within 48 hours. Severe redness persists or worsens If the area turns color, spreads or swells. If it blisters and opens, develops yellow-brown crust or bleeds. You develop a fever or chills. If the pain increases or becomes unbearable.  Are unable to keep fluids and food down.  MAKE SURE YOU   Understand these instructions. Will watch your condition. Will get help right away if you are not doing well or get worse.  Thank you for choosing an e-visit.  Your e-visit answers were reviewed by a board certified advanced clinical practitioner to complete your personal care plan. Depending upon the condition, your plan could have included both over the counter or prescription medications.  Please review your pharmacy choice. Make sure the pharmacy is open so you can pick up prescription now. If there is a problem, you may contact your provider through [employer] and have the prescription routed to another pharmacy.  Your safety is important to [place]. If you have drug allergies check your prescription carefully.   For the next 24 hours you can use MyChart to ask questions about  today's visit, request a non-urgent call back, or ask for a work or school excuse. You will get an email in the next two days asking about your experience. I hope that your e-visit has been valuable and will speed your recovery.

## 2020-07-09 NOTE — Progress Notes (Signed)
I have spent 5 minutes in review of e-visit questionnaire, review and updating patient chart, medical decision making and response to patient.   Krissy Orebaugh Cody Andromeda Poppen, PA-C    

## 2020-07-09 NOTE — Addendum Note (Signed)
Addended by: Waldon Merl on: 07/09/2020 08:20 AM   Modules accepted: Orders

## 2020-07-12 ENCOUNTER — Encounter: Payer: Self-pay | Admitting: Emergency Medicine

## 2020-07-12 ENCOUNTER — Ambulatory Visit
Admission: EM | Admit: 2020-07-12 | Discharge: 2020-07-12 | Disposition: A | Discharge status: Home / Self Care | Payer: Commercial Managed Care - PPO | Attending: Medical Oncology | Admitting: Medical Oncology

## 2020-07-12 ENCOUNTER — Other Ambulatory Visit: Payer: Self-pay

## 2020-07-12 DIAGNOSIS — R221 Localized swelling, mass and lump, neck: Secondary | ICD-10-CM

## 2020-07-12 MED ORDER — OMEPRAZOLE 20 MG PO CPDR: 20.0000 mg | DELAYED_RELEASE_CAPSULE | Freq: Every day | ORAL | 0 refills | Status: AC

## 2020-07-12 MED ORDER — PREDNISONE 10 MG (21) PO TBPK: ORAL_TABLET | Freq: Every day | ORAL | 0 refills | Status: DC

## 2020-07-12 NOTE — ED Provider Notes (Signed)
[facility]    CSN: [id number] Arrival date & time: 07/12/20  0916      History   Chief Complaint Chief Complaint  Patient presents with   Foreign Body    Sensation in throat    HPI Mackenzye D Griner is a 45 y.o. female.   HPI  Lump in Throat Sensation: PT reports that for the past 3 days she has had a lump in throat sensation.  She states that this started after starting Bactrim medication following fire ant bites.  She states that symptoms have been stable for the past 3 days.  She does feel like she has a lump in her throat and feels that she has to take small bites of food so as not to have discomfort.  She denies any choking episodes and denies that symptoms started after eating.  She denies intolerance of her saliva, vomiting, trouble breathing or trouble with normal swallowing.  She has not tried anything for symptoms.  She does have a history of GERD.  Past Medical History:  Diagnosis Date   Hypertension     There are no problems to display for this patient.   Past Surgical History:  Procedure Laterality Date   ANTERIOR CRUCIATE LIGAMENT REPAIR     DENTAL SURGERY     TONSILLECTOMY      OB History   No obstetric history on file.      Home Medications    Prior to Admission medications   Medication Sig Start Date End Date Taking? Authorizing Provider  hydrOXYzine (VISTARIL) 25 MG capsule Take 1 capsule (25 mg total) by mouth every 8 (eight) hours as needed. 07/09/20  Yes Waldon Merl, PA-C  norethindrone-ethinyl estradiol (JUNEL FE,GILDESS FE,LOESTRIN FE) 1-20 MG-MCG tablet Take 1 tablet by mouth daily.   Yes [provider]  sulfamethoxazole-trimethoprim (BACTRIM DS) 800-160 MG tablet Take 1 tablet by mouth 2 (two) times daily. 07/09/20  Yes Waldon Merl, PA-C  valsartan-hydrochlorothiazide (DIOVAN-HCT) 160-25 MG tablet Take 1 tablet by mouth daily.   Yes [provider]  famotidine (PEPCID) 20 MG tablet TK 1 T PO BID FOR STOMACH  ACID 06/16/18   [provider]  HYDROcodone-acetaminophen (NORCO/VICODIN) 5-325 MG tablet Take 1 tablet by mouth every 8 (eight) hours as needed for moderate pain or severe pain. 03/03/20   Tommie Sams, DO  fluticasone (FLONASE) 50 MCG/ACT nasal spray Place 2 sprays into both nostrils daily. 10/08/16 08/01/18  Lutricia Feil, PA-C  norgestrel-ethinyl estradiol (LOW-OGESTREL) 0.3-30 MG-MCG tablet Take 1 tablet by mouth daily.  08/01/18  [provider]    Family History Family History  Problem Relation Age of Onset   Diabetes Mother    COPD Mother    Other Father        auto accident   Breast cancer Maternal Grandmother        unsure of age    Social History Social History   Tobacco Use   Smoking status: Never   Smokeless tobacco: Never  Vaping Use   Vaping Use: Never used  Substance Use Topics   Alcohol use: Yes    Comment: social   Drug use: No     Allergies   Neosporin [neomycin-bacitracin zn-polymyx], Zithromax [azithromycin], and Penicillins   Review of Systems Review of Systems  As stated above in HPI Physical Exam Triage Vital Signs ED Triage Vitals  Enc Vitals Group     BP 07/12/20 0939 125/79     Pulse  Rate 07/12/20 0939 93     Resp 07/12/20 0939 18     Temp 07/12/20 0939 98.7 F (37.1 C)     Temp Source 07/12/20 0939 Oral     SpO2 07/12/20 0939 99 %     Weight 07/12/20 0937 250 lb (113.4 kg)     Height 07/12/20 0937 5\' 8"  (1.727 m)     Head Circumference --      Peak Flow --      Pain Score 07/12/20 0936 7     Pain Loc --      Pain Edu? --      Excl. in GC? --    No data found.  Updated Vital Signs BP 125/79 (BP Location: Left Arm)   Pulse 93   Temp 98.7 F (37.1 C) (Oral)   Resp 18   Ht 5\' 8"  (1.727 m)   Wt 250 lb (113.4 kg)   LMP 06/26/2020   SpO2 99%   BMI 38.01 kg/m   Physical Exam Vitals and nursing note reviewed.  Constitutional:      General: She is not in acute distress.    Appearance: Normal  appearance. She is not ill-appearing, toxic-appearing or diaphoretic.     Comments: No hoarseness of vocie  HENT:     Head: Normocephalic and atraumatic.     Nose: Nose normal.     Mouth/Throat:     Mouth: Mucous membranes are moist.     Pharynx: Oropharynx is clear.     Comments: NO lymphadenopathy or thyromegaly/thyroid nodules Cardiovascular:     Rate and Rhythm: Normal rate and regular rhythm.     Heart sounds: Normal heart sounds.  Pulmonary:     Effort: Pulmonary effort is normal.     Breath sounds: Normal breath sounds. No stridor.  Musculoskeletal:     Cervical back: Normal range of motion and neck supple. No tenderness.  Lymphadenopathy:     Cervical: No cervical adenopathy.  Skin:    General: Skin is warm.     Comments: Area of concern for cellulitis of the lower legs has healed   Neurological:     Mental Status: She is alert.     UC Treatments / Results  Labs (all labs ordered are listed, but only abnormal results are displayed) Labs Reviewed - No data to display  EKG   Radiology No results found.  Procedures Procedures (including critical care time)  Medications Ordered in UC Medications - No data to display  Initial Impression / Assessment and Plan / UC Course  I have reviewed the triage vital signs and the nursing notes.  Pertinent labs & imaging results that were available during my care of the patient were reviewed by me and considered in my medical decision making (see chart for details).     New.  Wide differential but I am concerned that this could be a reaction to the Bactrim.  As there are many other potential differentials for this we are not can add this to her allergy list but we will have this in mind in the future should she need to be on this antibiotic again.  She will stop Bactrim.  We will start her on prednisone and omeprazole.  Discussed how to take these medications along with common potential side effects and precautions.  I have  recommended that she call ENT to schedule a visit.  We discussed soft small bites of food and red flag signs and symptoms. Final Clinical Impressions(s) /  UC Diagnoses   Final diagnoses:  None   Discharge Instructions   None    ED Prescriptions   None    PDMP not reviewed this encounter.   Rushie Chestnut, [place] 07/12/20 1008

## 2020-07-12 NOTE — ED Triage Notes (Signed)
Pt c/o feeling of having something stuck in her throat. She states it is different than a sore throat but does hurt. Started about 3 days ago. She states she has been taking bactrim for ant bites on her ankles. Denies shortness of breath.

## 2020-09-16 ENCOUNTER — Other Ambulatory Visit: Payer: Self-pay | Admitting: Obstetrics and Gynecology

## 2020-09-16 DIAGNOSIS — Z1231 Encounter for screening mammogram for malignant neoplasm of breast: Secondary | ICD-10-CM

## 2020-10-23 ENCOUNTER — Other Ambulatory Visit: Payer: Self-pay

## 2020-10-23 ENCOUNTER — Ambulatory Visit
Admission: RE | Admit: 2020-10-23 | Discharge: 2020-10-23 | Disposition: A | Discharge status: Home / Self Care | Payer: Commercial Managed Care - PPO | Source: Ambulatory Visit | Attending: Obstetrics and Gynecology | Admitting: Obstetrics and Gynecology

## 2020-10-23 DIAGNOSIS — Z1231 Encounter for screening mammogram for malignant neoplasm of breast: Secondary | ICD-10-CM

## 2020-10-26 ENCOUNTER — Other Ambulatory Visit: Payer: Self-pay

## 2020-10-26 ENCOUNTER — Encounter: Payer: Self-pay | Admitting: Emergency Medicine

## 2020-10-26 ENCOUNTER — Ambulatory Visit
Admission: EM | Admit: 2020-10-26 | Discharge: 2020-10-26 | Disposition: A | Discharge status: Home / Self Care | Payer: Commercial Managed Care - PPO | Attending: Emergency Medicine | Admitting: Emergency Medicine

## 2020-10-26 DIAGNOSIS — R300 Dysuria: Secondary | ICD-10-CM

## 2020-10-26 LAB — URINALYSIS, COMPLETE (UACMP) WITH MICROSCOPIC
Glucose, UA: NEGATIVE mg/dL
Hgb urine dipstick: NEGATIVE
Ketones, ur: 40 mg/dL — AB
Nitrite: NEGATIVE
Protein, ur: NEGATIVE mg/dL
Specific Gravity, Urine: 1.02 (ref 1.005–1.030)
pH: 5.5 (ref 5.0–8.0)

## 2020-10-26 MED ORDER — CEPHALEXIN 500 MG PO CAPS: 500.0000 mg | ORAL_CAPSULE | Freq: Four times a day (QID) | ORAL | 0 refills | Status: AC

## 2020-10-26 NOTE — ED Provider Notes (Signed)
[facility]  ____________________________________________  Time seen: Approximately 8:33 AM  I have reviewed the triage vital signs and the nursing notes.   HISTORY  Chief Complaint Urinary Frequency and Abdominal Pain   Historian Patient     HPI Rachel Huynh is a 45 y.o. female with a history of hypertension and chronic urinary tract infections, presents to the urgent care with dysuria, increased urinary frequency and suprapubic pain.  Patient reports that she commonly has 2 urinary tract infections a year and has never been under the care of a urologist just seeks treatment.  She denies flank pain, nausea, vomiting or fever.  No changes in vaginal discharge or deep dyspareunia.   Past Medical History:  Diagnosis Date   Hypertension      Immunizations up to date:  Yes.     Past Medical History:  Diagnosis Date   Hypertension     There are no problems to display for this patient.   Past Surgical History:  Procedure Laterality Date   ANTERIOR CRUCIATE LIGAMENT REPAIR     DENTAL SURGERY     TONSILLECTOMY      Prior to Admission medications   Medication Sig Start Date End Date Taking? Authorizing Provider  cephALEXin (KEFLEX) 500 MG capsule Take 1 capsule (500 mg total) by mouth 4 (four) times daily for 10 days. 10/26/20 11/05/20 Yes Pia Mau M, PA-C  norethindrone-ethinyl estradiol (JUNEL FE,GILDESS FE,LOESTRIN FE) 1-20 MG-MCG tablet Take 1 tablet by mouth daily.   Yes [provider]  valsartan-hydrochlorothiazide (DIOVAN-HCT) 160-25 MG tablet Take 1 tablet by mouth daily.   Yes [provider]  famotidine (PEPCID) 20 MG tablet TK 1 T PO BID FOR STOMACH ACID 06/16/18   [provider]  HYDROcodone-acetaminophen (NORCO/VICODIN) 5-325 MG tablet Take 1 tablet by mouth every 8 (eight) hours as needed for moderate pain or severe pain. 03/03/20   Tommie Sams, DO  hydrOXYzine (VISTARIL) 25 MG capsule Take 1 capsule (25 mg  total) by mouth every 8 (eight) hours as needed. 07/09/20   Waldon Merl, PA-C  omeprazole (PRILOSEC) 20 MG capsule Take 1 capsule (20 mg total) by mouth daily. 07/12/20   Rushie Chestnut, PA-C  predniSONE (STERAPRED UNI-PAK 21 TAB) 10 MG (21) TBPK tablet Take by mouth daily. Take 6 tabs by mouth daily  for 2 days, then 5 tabs for 2 days, then 4 tabs for 2 days, then 3 tabs for 2 days, 2 tabs for 2 days, then 1 tab by mouth daily for 2 days 07/12/20   Rushie Chestnut, PA-C  sulfamethoxazole-trimethoprim (BACTRIM DS) 800-160 MG tablet Take 1 tablet by mouth 2 (two) times daily. 07/09/20   Waldon Merl, PA-C  fluticasone (FLONASE) 50 MCG/ACT nasal spray Place 2 sprays into both nostrils daily. 10/08/16 08/01/18  Lutricia Feil, PA-C  norgestrel-ethinyl estradiol (LOW-OGESTREL) 0.3-30 MG-MCG tablet Take 1 tablet by mouth daily.  08/01/18  [provider]    Allergies Neosporin [neomycin-bacitracin zn-polymyx], Zithromax [azithromycin], and Penicillins  Family History  Problem Relation Age of Onset   Diabetes Mother    COPD Mother    Other Father        auto accident   Breast cancer Maternal Grandmother        unsure of age    Social History Social History   Tobacco Use   Smoking status: Never   Smokeless tobacco: Never  Vaping Use   Vaping Use: Never used  Substance Use Topics  Alcohol use: Yes    Comment: social   Drug use: No     Review of Systems  Constitutional: No fever/chills Eyes:  No discharge ENT: No upper respiratory complaints. Respiratory: no cough. No SOB/ use of accessory muscles to breath Gastrointestinal:   No nausea, no vomiting.  No diarrhea.  No constipation. Genitourinary: Patient has dysuria and increased urinary frequency.  Musculoskeletal: Negative for musculoskeletal pain. Skin: Negative for rash, abrasions, lacerations, ecchymosis.   ____________________________________________   PHYSICAL EXAM:  VITAL SIGNS: ED Triage Vitals   Enc Vitals Group     BP 10/26/20 0819 (!) 134/91     Pulse Rate 10/26/20 0819 94     Resp 10/26/20 0819 14     Temp 10/26/20 0819 98.1 F (36.7 C)     Temp Source 10/26/20 0819 Oral     SpO2 10/26/20 0819 100 %     Weight 10/26/20 0815 220 lb (99.8 kg)     Height 10/26/20 0815 5\' 8"  (1.727 m)     Head Circumference --      Peak Flow --      Pain Score 10/26/20 0815 4     Pain Loc --      Pain Edu? --      Excl. in GC? --      Constitutional: Alert and oriented. Well appearing and in no acute distress. Eyes: Conjunctivae are normal. PERRL. EOMI. Head: Atraumatic. ENT: Cardiovascular: Normal rate, regular rhythm. Normal S1 and S2.  Good peripheral circulation. Respiratory: Normal respiratory effort without tachypnea or retractions. Lungs CTAB. Good air entry to the bases with no decreased or absent breath sounds Gastrointestinal: Bowel sounds x 4 quadrants.  Patient has mild suprapubic tenderness to palpation.  No guarding or rigidity. No distention.  No CVA tenderness. Musculoskeletal: Full range of motion to all extremities. No obvious deformities noted Neurologic:  Normal for age. No gross focal neurologic deficits are appreciated.  Skin:  Skin is warm, dry and intact. No rash noted. Psychiatric: Mood and affect are normal for age. Speech and behavior are normal.   ____________________________________________   LABS (all labs ordered are listed, but only abnormal results are displayed)  Labs Reviewed  URINALYSIS, COMPLETE (UACMP) WITH MICROSCOPIC - Abnormal; Notable for the following components:      Result Value   APPearance HAZY (*)    Bilirubin Urine SMALL (*)    Ketones, ur 40 (*)    Leukocytes,Ua SMALL (*)    Bacteria, UA FEW (*)    All other components within normal limits  URINE CULTURE   ____________________________________________  EKG   ____________________________________________  RADIOLOGY   No results  found.  ____________________________________________    PROCEDURES  Procedure(s) performed:     Procedures     Medications - No data to display   ____________________________________________   INITIAL IMPRESSION / ASSESSMENT AND PLAN / ED COURSE  Pertinent labs & imaging results that were available during my care of the patient were reviewed by me and considered in my medical decision making (see chart for details).      Assessment and plan Dysuria Increased urinary frequency 45 year old female presents to the urgent care with dysuria and increased urinary frequency for the past week.  Urinalysis concerning for UTI with leukocytes and bacteria given clean-catch.  Urine is being cultured.  We will start patient on Keflex 4 times daily for the next 7 days with referral to urology to be used as needed given history of chronic UTIs.  All patient questions were answered.    ____________________________________________  FINAL CLINICAL IMPRESSION(S) / ED DIAGNOSES  Final diagnoses:  Dysuria      NEW MEDICATIONS STARTED DURING THIS VISIT:  ED Discharge Orders          Ordered    cephALEXin (KEFLEX) 500 MG capsule  4 times daily        10/26/20 0844                This chart was dictated using voice recognition software/Dragon. Despite best efforts to proofread, errors can occur which can change the meaning. Any change was purely unintentional.     Orvil Feil, [place] 10/26/20 [phone]

## 2020-10-26 NOTE — ED Triage Notes (Signed)
Patient c/o urinary frequency and lower abdominal pressure that started a week ago.  Patient denies vaginal discharge.  Patient reports some blood in her urine couple of days ago.

## 2020-10-26 NOTE — Discharge Instructions (Signed)
Take Keflex four times daily for seven days.  °

## 2020-10-29 LAB — URINE CULTURE: Culture: >=100000 — AB

## 2020-11-25 ENCOUNTER — Other Ambulatory Visit: Payer: Self-pay

## 2020-11-25 ENCOUNTER — Ambulatory Visit
Admission: EM | Admit: 2020-11-25 | Discharge: 2020-11-25 | Disposition: A | Discharge status: Home / Self Care | Payer: Commercial Managed Care - PPO | Attending: Physician Assistant | Admitting: Physician Assistant

## 2020-11-25 DIAGNOSIS — R300 Dysuria: Secondary | ICD-10-CM | POA: Diagnosis not present

## 2020-11-25 DIAGNOSIS — N3001 Acute cystitis with hematuria: Secondary | ICD-10-CM | POA: Diagnosis not present

## 2020-11-25 LAB — URINALYSIS, COMPLETE (UACMP) WITH MICROSCOPIC
Bilirubin Urine: NEGATIVE
Glucose, UA: NEGATIVE mg/dL
Nitrite: NEGATIVE
Protein, ur: >300 mg/dL — AB
Specific Gravity, Urine: >1.03 — ABNORMAL HIGH (ref 1.005–1.030)
WBC, UA: >50 WBC/hpf (ref 0–5)
pH: 6 (ref 5.0–8.0)

## 2020-11-25 MED ORDER — CIPROFLOXACIN HCL 500 MG PO TABS: 500.0000 mg | ORAL_TABLET | Freq: Two times a day (BID) | ORAL | 0 refills | Status: AC

## 2020-11-25 MED ORDER — CIPROFLOXACIN HCL 500 MG PO TABS
500.0000 mg | ORAL_TABLET | Freq: Once | ORAL | Status: AC
  Administered 2020-11-25: 500 mg via ORAL

## 2020-11-25 MED ORDER — PHENAZOPYRIDINE HCL 200 MG PO TABS: 200.0000 mg | ORAL_TABLET | Freq: Three times a day (TID) | ORAL | 0 refills | Status: DC

## 2020-11-25 NOTE — ED Triage Notes (Signed)
Pt c/o urinary pain and frequency x 2 days.

## 2020-11-25 NOTE — Discharge Instructions (Addendum)

## 2020-11-25 NOTE — ED Provider Notes (Signed)
[facility]    CSN: [id number] Arrival date & time: 11/25/20  1736      History   Chief Complaint Chief Complaint  Patient presents with   uti symptoms    HPI Rachel Huynh is a 45 y.o. female presenting for onset of dysuria, urinary frequency and urgency as well as suprapubic discomfort and cramping today.  Patient has history of UTIs and reports 3-4 this year.  Patient treated 1 month ago for UTI with Keflex.  Reports that she took Keflex 4 times a day for 10 days.  Patient reports symptoms did resolve after completing antibiotics.  Presently denies any fever, fatigue, chills, flank pain or back pain.  She has had a little bit of spotting on the toilet paper when she wipes.  No vaginal discharge and does not report any concern for STIs.  Has not taken any OTC meds for symptoms today.  No other complaints.  HPI  Past Medical History:  Diagnosis Date   Hypertension     There are no problems to display for this patient.   Past Surgical History:  Procedure Laterality Date   ANTERIOR CRUCIATE LIGAMENT REPAIR     DENTAL SURGERY     TONSILLECTOMY      OB History   No obstetric history on file.      Home Medications    Prior to Admission medications   Medication Sig Start Date End Date Taking? Authorizing Provider  ciprofloxacin (CIPRO) 500 MG tablet Take 1 tablet (500 mg total) by mouth 2 (two) times daily for 7 days. 11/25/20 12/02/20 Yes Eusebio Friendly B, PA-C  famotidine (PEPCID) 20 MG tablet TK 1 T PO BID FOR STOMACH ACID 06/16/18  Yes [provider]  HYDROcodone-acetaminophen (NORCO/VICODIN) 5-325 MG tablet Take 1 tablet by mouth every 8 (eight) hours as needed for moderate pain or severe pain. 03/03/20  Yes Cook, Jayce G, DO  hydrOXYzine (VISTARIL) 25 MG capsule Take 1 capsule (25 mg total) by mouth every 8 (eight) hours as needed. 07/09/20  Yes Waldon Merl, PA-C  norethindrone-ethinyl estradiol (JUNEL FE,GILDESS FE,LOESTRIN FE) 1-20 MG-MCG  tablet Take 1 tablet by mouth daily.   Yes [provider]  omeprazole (PRILOSEC) 20 MG capsule Take 1 capsule (20 mg total) by mouth daily. 07/12/20  Yes Covington, Sarah M, PA-C  phenazopyridine (PYRIDIUM) 200 MG tablet Take 1 tablet (200 mg total) by mouth 3 (three) times daily. 11/25/20  Yes Shirlee Latch, PA-C  predniSONE (STERAPRED UNI-PAK 21 TAB) 10 MG (21) TBPK tablet Take by mouth daily. Take 6 tabs by mouth daily  for 2 days, then 5 tabs for 2 days, then 4 tabs for 2 days, then 3 tabs for 2 days, 2 tabs for 2 days, then 1 tab by mouth daily for 2 days 07/12/20  Yes Covington, Sarah M, PA-C  sulfamethoxazole-trimethoprim (BACTRIM DS) 800-160 MG tablet Take 1 tablet by mouth 2 (two) times daily. 07/09/20  Yes Waldon Merl, PA-C  valsartan-hydrochlorothiazide (DIOVAN-HCT) 160-25 MG tablet Take 1 tablet by mouth daily.   Yes [provider]  fluticasone (FLONASE) 50 MCG/ACT nasal spray Place 2 sprays into both nostrils daily. 10/08/16 08/01/18  Lutricia Feil, PA-C  norgestrel-ethinyl estradiol (LOW-OGESTREL) 0.3-30 MG-MCG tablet Take 1 tablet by mouth daily.  08/01/18  [provider]    Family History Family History  Problem Relation Age of Onset   Diabetes Mother    COPD Mother    Other Father  auto accident   Breast cancer Maternal Grandmother        unsure of age    Social History Social History   Tobacco Use   Smoking status: Never   Smokeless tobacco: Never  Vaping Use   Vaping Use: Never used  Substance Use Topics   Alcohol use: Yes    Comment: social   Drug use: No     Allergies   Neosporin [neomycin-bacitracin zn-polymyx], Zithromax [azithromycin], and Penicillins   Review of Systems Review of Systems  Constitutional:  Negative for chills, fatigue and fever.  Gastrointestinal:  Positive for abdominal pain. Negative for diarrhea, nausea and vomiting.  Genitourinary:  Positive for dysuria, frequency, hematuria and urgency.  Negative for decreased urine volume, flank pain, pelvic pain, vaginal bleeding, vaginal discharge and vaginal pain.  Musculoskeletal:  Negative for back pain.  Skin:  Negative for rash.    Physical Exam Triage Vital Signs ED Triage Vitals  Enc Vitals Group     BP 11/25/20 1816 131/79     Pulse Rate 11/25/20 1816 82     Resp 11/25/20 1816 16     Temp 11/25/20 1816 98 F (36.7 C)     Temp Source 11/25/20 1816 Oral     SpO2 11/25/20 1816 100 %     Weight 11/25/20 1815 220 lb (99.8 kg)     Height 11/25/20 1815 5\' 8"  (1.727 m)     Head Circumference --      Peak Flow --      Pain Score 11/25/20 1815 6     Pain Loc --      Pain Edu? --      Excl. in [place]? --    No data found.  Updated Vital Signs BP 131/79 (BP Location: Left Arm)   Pulse 82   Temp 98 F (36.7 C) (Oral)   Resp 16   Ht 5\' 8"  (1.727 m)   Wt 220 lb (99.8 kg)   SpO2 100%   BMI 33.45 kg/m      Physical Exam Vitals and nursing note reviewed.  Constitutional:      General: She is not in acute distress.    Appearance: Normal appearance. She is not ill-appearing or toxic-appearing.  HENT:     Head: Normocephalic and atraumatic.  Eyes:     General: No scleral icterus.       Right eye: No discharge.        Left eye: No discharge.     Conjunctiva/sclera: Conjunctivae normal.  Cardiovascular:     Rate and Rhythm: Normal rate and regular rhythm.     Heart sounds: Normal heart sounds.  Pulmonary:     Effort: Pulmonary effort is normal. No respiratory distress.     Breath sounds: Normal breath sounds.  Abdominal:     Palpations: Abdomen is soft.     Tenderness: There is abdominal tenderness (suprapubic). There is no right CVA tenderness or left CVA tenderness.  Musculoskeletal:     Cervical back: Neck supple.  Skin:    General: Skin is dry.  Neurological:     General: No focal deficit present.     Mental Status: She is alert. Mental status is at baseline.     Motor: No weakness.     Gait: Gait normal.   Psychiatric:        Mood and Affect: Mood normal.        Behavior: Behavior normal.        Thought Content:  Thought content normal.     UC Treatments / Results  Labs (all labs ordered are listed, but only abnormal results are displayed) Labs Reviewed  URINALYSIS, COMPLETE (UACMP) WITH MICROSCOPIC - Abnormal; Notable for the following components:      Result Value   APPearance CLOUDY (*)    Specific Gravity, Urine >1.030 (*)    Hgb urine dipstick LARGE (*)    Ketones, ur TRACE (*)    Protein, ur >300 (*)    Leukocytes,Ua SMALL (*)    Bacteria, UA FEW (*)    All other components within normal limits  URINE CULTURE    EKG   Radiology No results found.  Procedures Procedures (including critical care time)  Medications Ordered in UC Medications  ciprofloxacin (CIPRO) tablet 500 mg (500 mg Oral Given 11/25/20 1857)    Initial Impression / Assessment and Plan / UC Course  I have reviewed the triage vital signs and the nursing notes.  Pertinent labs & imaging results that were available during my care of the patient were reviewed by me and considered in my medical decision making (see chart for details).   45 year old female presenting for onset of dysuria, Rosanne Gutting frequency and urgency as well as lower abdominal discomfort/pain today.  History of UTI 1 month ago.  Patient completed course of Keflex.  I did review the urine culture which shows E. coli resistant to ampicillin and intermediate to ampicillin sulbactam, resistant to gentamicin and Bactrim DS.  The UA today shows cloudy urine, greater than 1.030 specific gravity, large hemoglobin, ketones, protein, small leukocytes.  We will send urine for culture and treat for suspected UTI.  We will treat based on last culture.  Treating with Cipro.  Patient unable to pick up her medication tonight so she has been given first dose in clinic.  Advised increased rest and fluids.  Also sent Pyridium.  Advised we will change her antibiotic  if needed based on her culture result in a couple of days.  Thoroughly reviewed return and ED precautions.  Advised following with PCP to get referral to urology.   Final Clinical Impressions(s) / UC Diagnoses   Final diagnoses:  Acute cystitis with hematuria  Dysuria     Discharge Instructions      UTI: Based on either symptoms or urinalysis, you may have a urinary tract infection. We will send the urine for culture and call with results in a few days. Begin antibiotics at this time. Your symptoms should be much improved over the next 2-3 days. Increase rest and fluid intake. If for some reason symptoms are worsening or not improving after a couple of days or the urine culture determines the antibiotics you are taking will not treat the infection, the antibiotics may be changed. Return or go to ER for fever, back pain, worsening urinary pain, discharge, increased blood in urine. May take Tylenol or Motrin OTC for pain relief or consider AZO if no contraindications      ED Prescriptions     Medication Sig Dispense Auth. Provider   ciprofloxacin (CIPRO) 500 MG tablet Take 1 tablet (500 mg total) by mouth 2 (two) times daily for 7 days. 14 tablet Laurene Footman B, PA-C   phenazopyridine (PYRIDIUM) 200 MG tablet Take 1 tablet (200 mg total) by mouth 3 (three) times daily. 6 tablet Gretta Cool      PDMP not reviewed this encounter.   Danton Clap, PA-C 11/25/20 1859

## 2020-11-26 LAB — URINE CULTURE: Culture: NO GROWTH

## 2020-12-10 ENCOUNTER — Encounter: Payer: Self-pay | Admitting: Emergency Medicine

## 2020-12-10 ENCOUNTER — Ambulatory Visit
Admission: EM | Admit: 2020-12-10 | Discharge: 2020-12-10 | Disposition: A | Discharge status: Home / Self Care | Payer: Commercial Managed Care - PPO | Attending: Physician Assistant | Admitting: Physician Assistant

## 2020-12-10 DIAGNOSIS — M79601 Pain in right arm: Secondary | ICD-10-CM | POA: Diagnosis not present

## 2020-12-10 MED ORDER — PREDNISONE 10 MG PO TABS: ORAL_TABLET | ORAL | 0 refills | Status: DC

## 2020-12-10 NOTE — ED Triage Notes (Signed)
Pt presents today with c/o right arm pain x "a couple months" with increase in pain from elbow up to shoulder. Denies injury.

## 2020-12-10 NOTE — ED Provider Notes (Signed)
[facility]    CSN: [id number] Arrival date & time: 12/10/20  0955      History   Chief Complaint Chief Complaint  Patient presents with   Extremity Pain    right    HPI Rachel Huynh is a 45 y.o. female presenting for approximately 20-month history of right upper arm pain.  Patient points to area around her triceps and elbow.  She says pain is intermittent but almost daily.  Nothing seems to make the pain worse.  She says that she raised her arm above her shoulder with her elbow bent the other night and it seemed to help her pain.  She has not had any sort of injury.  Pain has not worsened over time or improved.  She denies any pain of her shoulder or neck.  No associated numbness, weakness or tingling.  No similar problems in the past.  Reports occasional similar pain in the opposite arm.  She does work from home and is often on the computer.  Reports that she mostly works from her recliner.  She has occasionally taken over-the-counter ibuprofen and Tylenol for the pain and says it helps when she takes it.  She denies any associated swelling, bruising, redness, warmth of the skin.  No rashes.  No other complaints.  HPI  Past Medical History:  Diagnosis Date   Hypertension     There are no problems to display for this patient.   Past Surgical History:  Procedure Laterality Date   ANTERIOR CRUCIATE LIGAMENT REPAIR     DENTAL SURGERY     TONSILLECTOMY      OB History   No obstetric history on file.      Home Medications    Prior to Admission medications   Medication Sig Start Date End Date Taking? Authorizing Provider  norethindrone-ethinyl estradiol (JUNEL FE,GILDESS FE,LOESTRIN FE) 1-20 MG-MCG tablet Take 1 tablet by mouth daily.   Yes [provider]  predniSONE (DELTASONE) 10 MG tablet Take 6 tabs p.o. on day 1 and decrease by 1 tablet daily until complete 12/10/20  Yes Eusebio Friendly B, PA-C  valsartan-hydrochlorothiazide (DIOVAN-HCT) 160-25 MG  tablet Take 1 tablet by mouth daily.   Yes [provider]  famotidine (PEPCID) 20 MG tablet TK 1 T PO BID FOR STOMACH ACID 06/16/18   [provider]  HYDROcodone-acetaminophen (NORCO/VICODIN) 5-325 MG tablet Take 1 tablet by mouth every 8 (eight) hours as needed for moderate pain or severe pain. 03/03/20   Tommie Sams, DO  hydrOXYzine (VISTARIL) 25 MG capsule Take 1 capsule (25 mg total) by mouth every 8 (eight) hours as needed. 07/09/20   Waldon Merl, PA-C  omeprazole (PRILOSEC) 20 MG capsule Take 1 capsule (20 mg total) by mouth daily. 07/12/20   Rushie Chestnut, PA-C  phenazopyridine (PYRIDIUM) 200 MG tablet Take 1 tablet (200 mg total) by mouth 3 (three) times daily. 11/25/20   Eusebio Friendly B, PA-C  fluticasone (FLONASE) 50 MCG/ACT nasal spray Place 2 sprays into both nostrils daily. 10/08/16 08/01/18  Lutricia Feil, PA-C  norgestrel-ethinyl estradiol (LOW-OGESTREL) 0.3-30 MG-MCG tablet Take 1 tablet by mouth daily.  08/01/18  [provider]    Family History Family History  Problem Relation Age of Onset   Diabetes Mother    COPD Mother    Other Father        auto accident   Breast cancer Maternal Grandmother        unsure of age  Social History Social History   Tobacco Use   Smoking status: Never   Smokeless tobacco: Never  Vaping Use   Vaping Use: Never used  Substance Use Topics   Alcohol use: Yes    Comment: social   Drug use: No     Allergies   Neosporin [neomycin-bacitracin zn-polymyx], Zithromax [azithromycin], and Penicillins   Review of Systems Review of Systems  Constitutional:  Negative for fever.  Musculoskeletal:  Positive for arthralgias. Negative for back pain, joint swelling, neck pain and neck stiffness.  Skin:  Negative for color change, rash and wound.  Neurological:  Negative for weakness and numbness.    Physical Exam Triage Vital Signs ED Triage Vitals  Enc Vitals Group     BP 12/10/20 1130 139/86      Pulse Rate 12/10/20 1130 69     Resp 12/10/20 1130 16     Temp 12/10/20 1130 98.3 F (36.8 C)     Temp Source 12/10/20 1130 Oral     SpO2 12/10/20 1130 100 %     Weight --      Height --      Head Circumference --      Peak Flow --      Pain Score 12/10/20 1126 3     Pain Loc --      Pain Edu? --      Excl. in [place]? --    No data found.  Updated Vital Signs BP 139/86 (BP Location: Left Arm)   Pulse 69   Temp 98.3 F (36.8 C) (Oral)   Resp 16   LMP 11/13/2020 (Approximate)   SpO2 100%      Physical Exam Vitals and nursing note reviewed.  Constitutional:      General: She is not in acute distress.    Appearance: Normal appearance. She is not ill-appearing or toxic-appearing.  HENT:     Head: Normocephalic and atraumatic.  Eyes:     General: No scleral icterus.       Right eye: No discharge.        Left eye: No discharge.     Conjunctiva/sclera: Conjunctivae normal.  Cardiovascular:     Rate and Rhythm: Normal rate and regular rhythm.     Heart sounds: Normal heart sounds.  Pulmonary:     Effort: Pulmonary effort is normal. No respiratory distress.     Breath sounds: Normal breath sounds.  Musculoskeletal:     Right shoulder: Normal.     Right upper arm: Normal.     Right elbow: Normal.     Right forearm: Normal.     Cervical back: Normal range of motion and neck supple. No rigidity or tenderness.  Skin:    General: Skin is dry.  Neurological:     General: No focal deficit present.     Mental Status: She is alert. Mental status is at baseline.     Motor: No weakness.     Gait: Gait normal.  Psychiatric:        Mood and Affect: Mood normal.        Behavior: Behavior normal.        Thought Content: Thought content normal.     UC Treatments / Results  Labs (all labs ordered are listed, but only abnormal results are displayed) Labs Reviewed - No data to display  EKG   Radiology No results found.  Procedures Procedures (including critical care  time)  Medications Ordered in UC Medications - No data  to display  Initial Impression / Assessment and Plan / UC Course  I have reviewed the triage vital signs and the nursing notes.  Pertinent labs & imaging results that were available during my care of the patient were reviewed by me and considered in my medical decision making (see chart for details).  45 year old female presenting for 23-month history of atraumatic right arm pain.  Patient points to area around her triceps and elbow.  She does have full range of motion of every joint of the entire arm and denies any neck pain or stiffness.  No numbness, tingling or weakness.  No associated fever, fatigue, joint swelling or redness or bruising.  Patient works from home and is consistently on her computer.  Advised patient her arm pain could be radicular type pain from her neck.  It seems like when she works she has a strain of her neck.  Also advised patient her pain could be coming from resting her arms on something.  At this time I have suggested prednisone taper as well as heat or ice to the area and Tylenol for pain.  Advised following up with her PCP if not improving over the next week or if symptoms were to worsen.  She may need further work-up if not improving including labs to rule out lupus, rheumatoid arthritis, etc. or possibly MRI of her neck.  ED precautions reviewed.   Final Clinical Impressions(s) / UC Diagnoses   Final diagnoses:  Right arm pain     Discharge Instructions      -I believe your arm pain could be due to a pinched nerve in your neck.  I have sent a prednisone which hopefully will help. - It is also important that you pay attention to your posture and make sure you are not having your neck hyperflexed or hyperextended, especially when you are working on the computer.  Also pay attention to how you have your arms and try not to rest on your elbows. -You can apply heat or ice to the arm to see if it will help.   Tylenol for pain or ibuprofen. -If not feeling better in the next couple of weeks or symptoms worsen you should make a follow-up appointment with your PCP.  You might need labs to rule out forms of arthritis or may be an MRI of your neck. -Go to ER if you were to experience any associated swelling of the arm, redness, increased warmth, total weakness, you are dropping things when picking it up, severe neck pain or stiffness     ED Prescriptions     Medication Sig Dispense Auth. Provider   predniSONE (DELTASONE) 10 MG tablet Take 6 tabs p.o. on day 1 and decrease by 1 tablet daily until complete 21 tablet Shirlee Latch, PA-C      PDMP not reviewed this encounter.   Shirlee Latch, PA-C 12/10/20 1722

## 2020-12-10 NOTE — Discharge Instructions (Signed)
-  I believe your arm pain could be due to a pinched nerve in your neck.  I have sent a prednisone which hopefully will help. - It is also important that you pay attention to your posture and make sure you are not having your neck hyperflexed or hyperextended, especially when you are working on the computer.  Also pay attention to how you have your arms and try not to rest on your elbows. -You can apply heat or ice to the arm to see if it will help.  Tylenol for pain or ibuprofen. -If not feeling better in the next couple of weeks or symptoms worsen you should make a follow-up appointment with your PCP.  You might need labs to rule out forms of arthritis or may be an MRI of your neck. -Go to ER if you were to experience any associated swelling of the arm, redness, increased warmth, total weakness, you are dropping things when picking it up, severe neck pain or stiffness

## 2021-07-09 ENCOUNTER — Other Ambulatory Visit: Payer: Self-pay | Admitting: Obstetrics and Gynecology

## 2021-07-09 ENCOUNTER — Other Ambulatory Visit (HOSPITAL_COMMUNITY)
Admission: RE | Admit: 2021-07-09 | Discharge: 2021-07-09 | Disposition: A | Discharge status: Home / Self Care | Payer: Commercial Managed Care - PPO | Source: Ambulatory Visit | Attending: Obstetrics and Gynecology | Admitting: Obstetrics and Gynecology

## 2021-07-09 DIAGNOSIS — Z01419 Encounter for gynecological examination (general) (routine) without abnormal findings: Secondary | ICD-10-CM | POA: Diagnosis present

## 2021-07-10 LAB — CYTOLOGY - PAP
Comment: NEGATIVE
Diagnosis: NEGATIVE
High risk HPV: NEGATIVE

## 2021-11-07 ENCOUNTER — Ambulatory Visit
Admission: RE | Admit: 2021-11-07 | Discharge: 2021-11-07 | Disposition: A | Discharge status: Home / Self Care | Payer: Commercial Managed Care - PPO | Source: Ambulatory Visit | Attending: Emergency Medicine | Admitting: Emergency Medicine

## 2021-11-07 VITALS — BP 142/94 | HR 104 | Temp 98.8°F | Ht 68.0 in | Wt 220.0 lb

## 2021-11-07 DIAGNOSIS — Z20822 Contact with and (suspected) exposure to covid-19: Secondary | ICD-10-CM | POA: Insufficient documentation

## 2021-11-07 DIAGNOSIS — H6121 Impacted cerumen, right ear: Secondary | ICD-10-CM | POA: Insufficient documentation

## 2021-11-07 DIAGNOSIS — J22 Unspecified acute lower respiratory infection: Secondary | ICD-10-CM | POA: Diagnosis present

## 2021-11-07 LAB — RESP PANEL BY RT-PCR (FLU A&B, COVID) ARPGX2
Influenza A by PCR: NEGATIVE
Influenza B by PCR: NEGATIVE
SARS Coronavirus 2 by RT PCR: NEGATIVE

## 2021-11-07 MED ORDER — FLUTICASONE PROPIONATE 50 MCG/ACT NA SUSP: 2.0000 | Freq: Every day | NASAL | 0 refills | Status: DC

## 2021-11-07 MED ORDER — PREDNISONE 50 MG PO TABS: 50.0000 mg | ORAL_TABLET | Freq: Every day | ORAL | 0 refills | Status: DC

## 2021-11-07 MED ORDER — AEROCHAMBER MV MISC: 1 refills | Status: AC

## 2021-11-07 MED ORDER — HYDROCOD POLI-CHLORPHE POLI ER 10-8 MG/5ML PO SUER: 5.0000 mL | Freq: Two times a day (BID) | ORAL | 0 refills | Status: DC | PRN

## 2021-11-07 MED ORDER — DOXYCYCLINE HYCLATE 100 MG PO CAPS
100.0000 mg | ORAL_CAPSULE | Freq: Two times a day (BID) | ORAL | 0 refills | Status: AC
Start: 2021-11-07 — End: 2021-11-12

## 2021-11-07 MED ORDER — ALBUTEROL SULFATE HFA 108 (90 BASE) MCG/ACT IN AERS: 1.0000 | INHALATION_SPRAY | RESPIRATORY_TRACT | 0 refills | Status: AC | PRN

## 2021-11-07 NOTE — ED Provider Notes (Signed)
HPI  SUBJECTIVE:  Rachel Huynh is a 46 y.o. female who presents with 4 to 5 days of cough occasionally productive of mucus, chest congestion, chest tightness, soreness, clear rhinorrhea, postnasal drip, wheezing, bilateral ear pain.  She is unable to sleep at night secondary to the cough.  No fevers, body aches, headaches, nasal congestion, sinus pain or pressure, change in hearing, otorrhea, nausea, vomiting, diarrhea, abdominal pain.  No antibiotics in the past few months.  No antipyretic in the past 6 hours.  She has tried ibuprofen, Tessalon, warm tea, DayQuil, NyQuil, TheraFlu without improvement in her symptoms.  Symptoms are worse with going out into the cold air.  No known COVID or flu exposure.  She did not get the COVID vaccines, but got this years flu vaccine.  She has a past medical history of frequent bronchitis and pneumonia, hypertension.  No history of pulmonary disease, smoking.  LMP: Mid October.  Denies the possibility being pregnant.  PCP: [facility]    Past Medical History:  Diagnosis Date   Hypertension     Past Surgical History:  Procedure Laterality Date   ANTERIOR CRUCIATE LIGAMENT REPAIR     DENTAL SURGERY     TONSILLECTOMY      Family History  Problem Relation Age of Onset   Diabetes Mother    COPD Mother    Other Father        auto accident   Breast cancer Maternal Grandmother        unsure of age    Social History   Tobacco Use   Smoking status: Never   Smokeless tobacco: Never  Vaping Use   Vaping Use: Never used  Substance Use Topics   Alcohol use: Yes    Comment: social   Drug use: No    No current facility-administered medications for this encounter.  Current Outpatient Medications:    albuterol (VENTOLIN HFA) 108 (90 Base) MCG/ACT inhaler, Inhale 1-2 puffs into the lungs every 4 (four) hours as needed for wheezing or shortness of breath., Disp: 1 each, Rfl: 0   chlorpheniramine-HYDROcodone (TUSSIONEX) 10-8 MG/5ML, Take 5 mLs  by mouth every 12 (twelve) hours as needed for cough., Disp: 60 mL, Rfl: 0   diltiazem (CARDIZEM CD) 120 MG 24 hr capsule, Take 120 mg by mouth daily., Disp: , Rfl:    doxycycline (VIBRAMYCIN) 100 MG capsule, Take 1 capsule (100 mg total) by mouth 2 (two) times daily for 5 days., Disp: 10 capsule, Rfl: 0   fluticasone (FLONASE) 50 MCG/ACT nasal spray, Place 2 sprays into both nostrils daily., Disp: 16 g, Rfl: 0   norethindrone-ethinyl estradiol (JUNEL FE,GILDESS FE,LOESTRIN FE) 1-20 MG-MCG tablet, Take 1 tablet by mouth daily., Disp: , Rfl:    predniSONE (DELTASONE) 50 MG tablet, Take 1 tablet (50 mg total) by mouth daily with breakfast., Disp: 5 tablet, Rfl: 0   Spacer/Aero-Holding Chambers (AEROCHAMBER MV) inhaler, Use as instructed, Disp: 1 each, Rfl: 1   valsartan-hydrochlorothiazide (DIOVAN-HCT) 160-25 MG tablet, Take 1 tablet by mouth daily., Disp: , Rfl:    famotidine (PEPCID) 20 MG tablet, TK 1 T PO BID FOR STOMACH ACID, Disp: , Rfl:    omeprazole (PRILOSEC) 20 MG capsule, Take 1 capsule (20 mg total) by mouth daily., Disp: 14 capsule, Rfl: 0  Allergies  Allergen Reactions   Neosporin [Neomycin-Bacitracin Zn-Polymyx] Hives   Zithromax [Azithromycin] Nausea And Vomiting   Penicillins Rash     ROS  As noted in HPI.   Physical  Exam  BP (!) 142/94 (BP Location: Left Arm)   Pulse (!) 104   Temp 98.8 F (37.1 C) (Oral)   Ht 5\' 8"  (1.727 m)   Wt 99.8 kg   LMP 10/22/2021 (Exact Date)   SpO2 98%   BMI 33.45 kg/m   Constitutional: Well developed, well nourished, no acute distress.  Coughing uncontrollably. Eyes:  EOMI, conjunctiva normal bilaterally HENT: Normocephalic, atraumatic,mucus membranes moist.  Left TM normal.  Right TM obscured with cerumen.  Was able to remove some cerumen with a curette and visualized TM.  Right TM normal.  Tonsils surgically absent.  Normal oropharynx.  No postnasal drip. Respiratory: Normal inspiratory effort, lungs clear bilaterally.  Positive  prolonged expiratory phase.  Positive right lower chest wall tenderness. Neck: Positive cervical lymphadenopathy Cardiovascular: Regular tachycardia, no murmurs, rubs, gallops GI: nondistended skin: No rash, skin intact Musculoskeletal: no deformities Neurologic: Alert & oriented x 3, no focal neuro deficits Psychiatric: Speech and behavior appropriate   ED Course   Medications - No data to display  Orders Placed This Encounter  Procedures   Resp Panel by RT-PCR (Flu A&B, Covid) Anterior Nasal Swab    Standing Status:   Standing    Number of Occurrences:   1    Results for orders placed or performed during the hospital encounter of 11/07/21 (from the past 24 hour(s))  Resp Panel by RT-PCR (Flu A&B, Covid) Anterior Nasal Swab     Status: None   Collection Time: 11/07/21 11:23 AM   Specimen: Anterior Nasal Swab  Result Value Ref Range   SARS Coronavirus 2 by RT PCR NEGATIVE NEGATIVE   Influenza A by PCR NEGATIVE NEGATIVE   Influenza B by PCR NEGATIVE NEGATIVE   No results found.  ED Clinical Impression  1. Lower respiratory tract infection   2. Encounter for laboratory testing for COVID-19 virus      ED Assessment/Plan      [place] Narcotic database reviewed for this patient, and feel that the risk/benefit ratio today is favorable for proceeding with a prescription for controlled substance.  3 Opiate prescriptions in the past 2 years, last filled 2022.  Checking COVID, flu.  Discussed that she is out of the treatment window for influenza, but due to unvaccinated status, still qualifies for antiviral treatment for COVID.  COVID, flu, influenza negative.  Patient has an acute illness with systemic symptoms of tachycardia. Presentation consistent with a lower respiratory tract infection/bronchitis.  Will send home with regularly scheduled albuterol inhaler with a spacer, prednisone 50 mg for 5 days, Flonase, saline nasal irrigation, Tussionex 5 mL twice daily.  Wait-and-see  prescription of doxycycline 100 mg p.o. twice daily for 5 days if she does not improve in a day or 2, which will cover any possible pneumonia.  Did not do chest x-ray as it would not change management..  Discussed labs, MDM, treatment plan, and plan for follow-up with patient. Discussed sn/sx that should prompt return to the ED. patient agrees with plan.   Meds ordered this encounter  Medications   fluticasone (FLONASE) 50 MCG/ACT nasal spray    Sig: Place 2 sprays into both nostrils daily.    Dispense:  16 g    Refill:  0   albuterol (VENTOLIN HFA) 108 (90 Base) MCG/ACT inhaler    Sig: Inhale 1-2 puffs into the lungs every 4 (four) hours as needed for wheezing or shortness of breath.    Dispense:  1 each    Refill:  0  chlorpheniramine-HYDROcodone (TUSSIONEX) 10-8 MG/5ML    Sig: Take 5 mLs by mouth every 12 (twelve) hours as needed for cough.    Dispense:  60 mL    Refill:  0   Spacer/Aero-Holding Chambers (AEROCHAMBER MV) inhaler    Sig: Use as instructed    Dispense:  1 each    Refill:  1   doxycycline (VIBRAMYCIN) 100 MG capsule    Sig: Take 1 capsule (100 mg total) by mouth 2 (two) times daily for 5 days.    Dispense:  10 capsule    Refill:  0   predniSONE (DELTASONE) 50 MG tablet    Sig: Take 1 tablet (50 mg total) by mouth daily with breakfast.    Dispense:  5 tablet    Refill:  0      *This clinic note was created using Scientist, clinical (histocompatibility and immunogenetics). Therefore, there may be occasional mistakes despite careful proofreading.  ?    Domenick Gong, MD 11/08/21 [phone]

## 2021-11-07 NOTE — ED Triage Notes (Signed)
Pt c/o cough x4 days, pt states she is prone to bronchitis, bilateral ears aching

## 2021-11-07 NOTE — Discharge Instructions (Signed)
2 puffs from your albuterol inhaler using your spacer every 4 hours for 2 days, then every 6 hours for 2 days, then as needed.  You can back off on the albuterol if you start to improve sooner.  Finish the prednisone, unless a healthcare professional tells you to stop.  Cough syrup as needed.  Flonase, saline nasal irrigation with a NeilMed sinus rinse and distilled water as often as you want for the nasal congestion and postnasal drip.  Wait-and-see prescription of doxycycline.

## 2021-11-14 ENCOUNTER — Telehealth: Payer: Commercial Managed Care - PPO | Admitting: Physician Assistant

## 2021-11-14 DIAGNOSIS — J22 Unspecified acute lower respiratory infection: Secondary | ICD-10-CM | POA: Diagnosis not present

## 2021-11-14 MED ORDER — PROMETHAZINE-DM 6.25-15 MG/5ML PO SYRP: 5.0000 mL | ORAL_SOLUTION | Freq: Four times a day (QID) | ORAL | 0 refills | Status: DC | PRN

## 2021-11-14 NOTE — Progress Notes (Signed)
We are sorry that you are not feeling well.  Here is how we plan to help!  In addition you may use A prescription cough syrup called Promethazine DM Take 43mL every 6 hours as needed for cough.  From your responses in the eVisit questionnaire you describe inflammation in the upper respiratory tract which is causing a significant cough.  This is commonly called Bronchitis and has four common causes:   Allergies Viral Infections Acid Reflux Bacterial Infection Allergies, viruses and acid reflux are treated by controlling symptoms or eliminating the cause. An example might be a cough caused by taking certain blood pressure medications. You stop the cough by changing the medication. Another example might be a cough caused by acid reflux. Controlling the reflux helps control the cough.  USE OF BRONCHODILATOR ("RESCUE") INHALERS: There is a risk from using your bronchodilator too frequently.  The risk is that over-reliance on a medication which only relaxes the muscles surrounding the breathing tubes can reduce the effectiveness of medications prescribed to reduce swelling and congestion of the tubes themselves.  Although you feel brief relief from the bronchodilator inhaler, your asthma may actually be worsening with the tubes becoming more swollen and filled with mucus.  This can delay other crucial treatments, such as oral steroid medications. If you need to use a bronchodilator inhaler daily, several times per day, you should discuss this with your provider.  There are probably better treatments that could be used to keep your asthma under control.     HOME CARE Only take medications as instructed by your medical team. Complete the entire course of an antibiotic. Drink plenty of fluids and get plenty of rest. Avoid close contacts especially the very young and the elderly Cover your mouth if you cough or cough into your sleeve. Always remember to wash your hands A steam or ultrasonic humidifier can  help congestion.   GET HELP RIGHT AWAY IF: You develop worsening fever. You become short of breath You cough up blood. Your symptoms persist after you have completed your treatment plan MAKE SURE YOU  Understand these instructions. Will watch your condition. Will get help right away if you are not doing well or get worse.    Thank you for choosing an e-visit.  Your e-visit answers were reviewed by a board certified advanced clinical practitioner to complete your personal care plan. Depending upon the condition, your plan could have included both over the counter or prescription medications.  Please review your pharmacy choice. Make sure the pharmacy is open so you can pick up prescription now. If there is a problem, you may contact your provider through [employer] and have the prescription routed to another pharmacy.  Your safety is important to [place]. If you have drug allergies check your prescription carefully.   For the next 24 hours you can use MyChart to ask questions about today's visit, request a non-urgent call back, or ask for a work or school excuse. You will get an email in the next two days asking about your experience. I hope that your e-visit has been valuable and will speed your recovery.  I have spent 5 minutes in review of e-visit questionnaire, review and updating patient chart, medical decision making and response to patient.   Margaretann Loveless, PA-C

## 2021-12-13 ENCOUNTER — Ambulatory Visit
Admission: EM | Admit: 2021-12-13 | Discharge: 2021-12-13 | Disposition: A | Discharge status: Home / Self Care | Payer: Commercial Managed Care - PPO | Attending: Internal Medicine | Admitting: Internal Medicine

## 2021-12-13 ENCOUNTER — Encounter: Payer: Self-pay | Admitting: Emergency Medicine

## 2021-12-13 ENCOUNTER — Ambulatory Visit (INDEPENDENT_AMBULATORY_CARE_PROVIDER_SITE_OTHER): Payer: Commercial Managed Care - PPO

## 2021-12-13 DIAGNOSIS — R059 Cough, unspecified: Secondary | ICD-10-CM | POA: Diagnosis not present

## 2021-12-13 DIAGNOSIS — J40 Bronchitis, not specified as acute or chronic: Secondary | ICD-10-CM

## 2021-12-13 MED ORDER — LEVOFLOXACIN 500 MG PO TABS: 500.0000 mg | ORAL_TABLET | Freq: Every day | ORAL | 0 refills | Status: DC

## 2021-12-13 MED ORDER — IPRATROPIUM-ALBUTEROL 0.5-2.5 (3) MG/3ML IN SOLN
3.0000 mL | Freq: Once | RESPIRATORY_TRACT | Status: AC
Start: 2021-12-13 — End: 2021-12-13
  Administered 2021-12-13: 3 mL via RESPIRATORY_TRACT

## 2021-12-13 MED ORDER — HYDROCOD POLI-CHLORPHE POLI ER 10-8 MG/5ML PO SUER: 5.0000 mL | Freq: Every evening | ORAL | 0 refills | Status: DC | PRN

## 2021-12-13 MED ORDER — LEVALBUTEROL TARTRATE 45 MCG/ACT IN AERO
2.0000 | INHALATION_SPRAY | Freq: Four times a day (QID) | RESPIRATORY_TRACT | 0 refills | Status: AC
Start: 2021-12-13 — End: ?

## 2021-12-13 NOTE — ED Triage Notes (Signed)
Patient c/o cough and chest congestion for the past 5 weeks.  Patient states that four the past 4 days her symptoms have worse and is starting to have pain and soreness in her back from coughing.  Patient unsure of fevers.

## 2021-12-13 NOTE — ED Provider Notes (Signed)
[facility]    CSN: [id number] Arrival date & time: 12/13/21  0802      History   Chief Complaint Chief Complaint  Patient presents with   Cough    HPI Rachel Huynh is a 46 y.o. female who presents due to unresolved cough which she has had for 5 weeks now. She was seen here 11/3  for viral bronchitis and placed on prednisone, Albuterol inhaler and Tussionex, and told that if in 2 days she  was no improving to fill the Doxy rx which she did and completed it. Has been having chest and back pain and cough attacks, and feeling worse yesterday.Felt she may have had a fever last night, but did not check it. Has not been using the Albuterol since it causes her heart to raise and gets dizzy.     Past Medical History:  Diagnosis Date   Hypertension     There are no problems to display for this patient.   Past Surgical History:  Procedure Laterality Date   ANTERIOR CRUCIATE LIGAMENT REPAIR     DENTAL SURGERY     TONSILLECTOMY      OB History   No obstetric history on file.      Home Medications    Prior to Admission medications   Medication Sig Start Date End Date Taking? Authorizing Provider  chlorpheniramine-HYDROcodone (TUSSIONEX) 10-8 MG/5ML Take 5 mLs by mouth at bedtime as needed for cough. 12/13/21  Yes Rodriguez-Southworth, Nettie Elm, PA-C  levalbuterol [facility] HFA) 45 MCG/ACT inhaler Inhale 2 puffs into the lungs 4 (four) times daily. For 5 days then as needed 12/13/21  Yes Rodriguez-Southworth, Nettie Elm, PA-C  levofloxacin (LEVAQUIN) 500 MG tablet Take 1 tablet (500 mg total) by mouth daily. 12/13/21  Yes Rodriguez-Southworth, Nettie Elm, PA-C  albuterol (VENTOLIN HFA) 108 (90 Base) MCG/ACT inhaler Inhale 1-2 puffs into the lungs every 4 (four) hours as needed for wheezing or shortness of breath. 11/07/21   Domenick Gong, MD  diltiazem (CARDIZEM CD) 120 MG 24 hr capsule Take 120 mg by mouth daily. 10/20/21   [provider]  famotidine (PEPCID) 20 MG  tablet TK 1 T PO BID FOR STOMACH ACID 06/16/18   [provider]  fluticasone (FLONASE) 50 MCG/ACT nasal spray Place 2 sprays into both nostrils daily. 11/07/21   Domenick Gong, MD  norethindrone-ethinyl estradiol (JUNEL FE,GILDESS FE,LOESTRIN FE) 1-20 MG-MCG tablet Take 1 tablet by mouth daily.    [provider]  omeprazole (PRILOSEC) 20 MG capsule Take 1 capsule (20 mg total) by mouth daily. 07/12/20   Rushie Chestnut, PA-C  Spacer/Aero-Holding Chambers (AEROCHAMBER MV) inhaler Use as instructed 11/07/21   Domenick Gong, MD  valsartan-hydrochlorothiazide (DIOVAN-HCT) 160-25 MG tablet Take 1 tablet by mouth daily.    [provider]  norgestrel-ethinyl estradiol (LOW-OGESTREL) 0.3-30 MG-MCG tablet Take 1 tablet by mouth daily.  08/01/18  [provider]    Family History Family History  Problem Relation Age of Onset   Diabetes Mother    COPD Mother    Other Father        auto accident   Breast cancer Maternal Grandmother        unsure of age    Social History Social History   Tobacco Use   Smoking status: Never   Smokeless tobacco: Never  Vaping Use   Vaping Use: Never used  Substance Use Topics   Alcohol use: Yes    Comment: social   Drug use: No  Allergies   Neosporin [neomycin-bacitracin zn-polymyx], Zithromax [azithromycin], and Penicillins   Review of Systems Review of Systems  Constitutional:  Negative for fever.  HENT:  Positive for postnasal drip. Negative for congestion, ear discharge, ear pain and rhinorrhea.   Eyes:  Negative for discharge.  Respiratory:  Positive for cough.   Cardiovascular:  Positive for chest pain.  Musculoskeletal:  Positive for back pain.  Hematological:  Negative for adenopathy.     Physical Exam Triage Vital Signs ED Triage Vitals  Enc Vitals Group     BP 12/13/21 0816 135/81     Pulse Rate 12/13/21 0816 90     Resp 12/13/21 0816 14     Temp 12/13/21 0816 99.1 F (37.3 C)      Temp Source 12/13/21 0816 Oral     SpO2 12/13/21 0816 97 %     Weight 12/13/21 0814 220 lb (99.8 kg)     Height 12/13/21 0814 5\' 8"  (1.727 m)     Head Circumference --      Peak Flow --      Pain Score 12/13/21 0814 6     Pain Loc --      Pain Edu? --      Excl. in GC? --    No data found.  Updated Vital Signs BP 135/81 (BP Location: Left Arm)   Pulse 90   Temp 99.1 F (37.3 C) (Oral)   Resp 14   Ht 5\' 8"  (1.727 m)   Wt 220 lb (99.8 kg)   LMP 12/09/2021 (Exact Date)   SpO2 97%   BMI 33.45 kg/m   Visual Acuity Right Eye Distance:   Left Eye Distance:   Bilateral Distance:    Right Eye Near:   Left Eye Near:    Bilateral Near:     Physical Exam Constitutional:      General: He is not in acute distress.    Appearance: He is not toxic-appearing.  HENT:     Head: Normocephalic.     Right Ear: Tympanic membrane, ear canal and external ear normal.     Left Ear: Ear canal and external ear normal.     Nose: Nose normal.     Mouth/Throat:     Mouth: Mucous membranes are moist.     Pharynx: Oropharynx is clear.  Eyes:     General: No scleral icterus.    Conjunctiva/sclera: Conjunctivae normal.  Cardiovascular:     Rate and Rhythm: Normal rate and regular rhythm.     Heart sounds: No murmur heard.   Pulmonary:     Effort: Pulmonary effort is normal. No respiratory distress.     Breath sounds: Mild Wheezing present on R mid and lower lung and LLL.     Comments: Has auditory wheezie sounding cough Musculoskeletal:        General: Normal range of motion.     Cervical back: Neck supple.  Lymphadenopathy:     Cervical: No cervical adenopathy.  Skin:    General: Skin is warm and dry.     Findings: No rash.  Neurological:     Mental Status: He is alert and oriented to person, place, and time.     Gait: Gait normal.  Psychiatric:        Mood and Affect: Mood normal.        Behavior: Behavior normal.        Thought Content: Thought content normal.        Judgment:  Judgment  normal.   UC Treatments / Results  Labs (all labs ordered are listed, but only abnormal results are displayed) Labs Reviewed - No data to display  EKG   Radiology DG Chest 2 View  Result Date: 12/13/2021 CLINICAL DATA:  Cough for 5 weeks. EXAM: CHEST - 2 VIEW COMPARISON:  01/26/2020 FINDINGS: Normal heart size and mediastinal contours. No pleural effusion or edema. No airspace opacities identified. Visualized osseous structures are unremarkable. IMPRESSION: No active cardiopulmonary disease. Electronically Signed   By: Signa Kell M.D.   On: 12/13/2021 08:38    Procedures Procedures (including critical care time)  Medications Ordered in UC Medications  ipratropium-albuterol (DUONEB) 0.5-2.5 (3) MG/3ML nebulizer solution 3 mL (3 mLs Nebulization Given 12/13/21 5883)    Initial Impression / Assessment and Plan / UC Course  I have reviewed the triage vital signs and the nursing notes. Pertinent  imaging results that were available during my care of the patient were reviewed by me and considered in my medical decision making (see chart for details). She was given Duo neb treatment which helped the wheezing resolve Unresolved Bronchitis  I placed her on xopenex inhaler and Levaquin as noted. I refilled the Tussionex.     Final Clinical Impressions(s) / UC Diagnoses   Final diagnoses:  Bronchitis   Discharge Instructions   None    ED Prescriptions     Medication Sig Dispense Auth. Provider   levofloxacin (LEVAQUIN) 500 MG tablet Take 1 tablet (500 mg total) by mouth daily. 7 tablet Rodriguez-Southworth, Nettie Elm, PA-C   levalbuterol (XOPENEX HFA) 45 MCG/ACT inhaler Inhale 2 puffs into the lungs 4 (four) times daily. For 5 days then as needed 1 each Rodriguez-Southworth, Nettie Elm, PA-C   chlorpheniramine-HYDROcodone (TUSSIONEX) 10-8 MG/5ML Take 5 mLs by mouth at bedtime as needed for cough. 115 mL Rodriguez-Southworth, Nettie Elm, PA-C      I have reviewed the PDMP  during this encounter.   Garey Ham, [place] 12/13/21 2549

## 2022-05-04 ENCOUNTER — Other Ambulatory Visit: Payer: Self-pay | Admitting: Obstetrics and Gynecology

## 2022-05-04 DIAGNOSIS — Z1231 Encounter for screening mammogram for malignant neoplasm of breast: Secondary | ICD-10-CM

## 2022-05-09 ENCOUNTER — Ambulatory Visit: Payer: Commercial Managed Care - PPO

## 2022-05-09 ENCOUNTER — Ambulatory Visit
Admission: RE | Admit: 2022-05-09 | Discharge: 2022-05-09 | Disposition: A | Discharge status: Home / Self Care | Payer: BC Managed Care – PPO | Source: Ambulatory Visit | Attending: Family Medicine | Admitting: Family Medicine

## 2022-05-09 ENCOUNTER — Ambulatory Visit (INDEPENDENT_AMBULATORY_CARE_PROVIDER_SITE_OTHER): Payer: BC Managed Care – PPO

## 2022-05-09 VITALS — BP 150/90 | HR 99 | Temp 98.6°F | Resp 14 | Ht 68.0 in | Wt 220.0 lb

## 2022-05-09 DIAGNOSIS — J989 Respiratory disorder, unspecified: Secondary | ICD-10-CM

## 2022-05-09 MED ORDER — PREDNISONE 20 MG PO TABS: 40.0000 mg | ORAL_TABLET | Freq: Every day | ORAL | 0 refills | Status: AC

## 2022-05-09 MED ORDER — CEFDINIR 300 MG PO CAPS: 300.0000 mg | ORAL_CAPSULE | Freq: Two times a day (BID) | ORAL | 0 refills | Status: DC

## 2022-05-09 MED ORDER — PROMETHAZINE-DM 6.25-15 MG/5ML PO SYRP: 5.0000 mL | ORAL_SOLUTION | Freq: Four times a day (QID) | ORAL | 0 refills | Status: DC | PRN

## 2022-05-09 NOTE — ED Triage Notes (Signed)
Patient c/o cough and chest congestion for the past 4-5 days.  Patient has been using her albuterol inhaler.  Patient reports such is prone to bronchitis.  Patient denies fevers.

## 2022-05-09 NOTE — ED Provider Notes (Signed)
[facility]    CSN: [id number] Arrival date & time: 05/09/22  1036      History   Chief Complaint Chief Complaint  Patient presents with   Cough    Appointment    HPI Rachel Huynh is a 47 y.o. female.   HPI   Rachel Huynh presents for cough for the past 5 days. She has not sleep in 2 days . She feels tightness in her chest and back. She tried albuterol, tessalon perles, NyQuil, Mucinex and alka seltzer without relief. She had some post-tussive emesis yesterday. No diarrhea, fever.  Denies history of smoking.  States that she has history of exercise-induced asthma.      Past Medical History:  Diagnosis Date   Hypertension     There are no problems to display for this patient.   Past Surgical History:  Procedure Laterality Date   ANTERIOR CRUCIATE LIGAMENT REPAIR     DENTAL SURGERY     TONSILLECTOMY      OB History   No obstetric history on file.      Home Medications    Prior to Admission medications   Medication Sig Start Date End Date Taking? Authorizing Provider  cefdinir (OMNICEF) 300 MG capsule Take 1 capsule (300 mg total) by mouth 2 (two) times daily. 05/14/22  Yes Tracie Dore, DO  predniSONE (DELTASONE) 20 MG tablet Take 2 tablets (40 mg total) by mouth daily for 5 days. 05/09/22 05/14/22 Yes Kendrah Lovern, DO  promethazine-dextromethorphan (PROMETHAZINE-DM) 6.25-15 MG/5ML syrup Take 5 mLs by mouth 4 (four) times daily as needed. 05/09/22  Yes Lyza Houseworth, DO  albuterol (VENTOLIN HFA) 108 (90 Base) MCG/ACT inhaler Inhale 1-2 puffs into the lungs every 4 (four) hours as needed for wheezing or shortness of breath. 11/07/21   Domenick Gong, MD  diltiazem (CARDIZEM CD) 120 MG 24 hr capsule Take 120 mg by mouth daily. 10/20/21   [provider]  famotidine (PEPCID) 20 MG tablet TK 1 T PO BID FOR STOMACH ACID 06/16/18   [provider]  fluticasone (FLONASE) 50 MCG/ACT nasal spray Place 2 sprays into both nostrils daily. 11/07/21    Domenick Gong, MD  levalbuterol [facility] HFA) 45 MCG/ACT inhaler Inhale 2 puffs into the lungs 4 (four) times daily. For 5 days then as needed 12/13/21   Rodriguez-Southworth, Nettie Elm, PA-C  norethindrone-ethinyl estradiol (JUNEL FE,GILDESS FE,LOESTRIN FE) 1-20 MG-MCG tablet Take 1 tablet by mouth daily.    [provider]  omeprazole (PRILOSEC) 20 MG capsule Take 1 capsule (20 mg total) by mouth daily. 07/12/20   Rushie Chestnut, PA-C  Spacer/Aero-Holding Chambers (AEROCHAMBER MV) inhaler Use as instructed 11/07/21   Domenick Gong, MD  valsartan-hydrochlorothiazide (DIOVAN-HCT) 160-25 MG tablet Take 1 tablet by mouth daily.    [provider]  norgestrel-ethinyl estradiol (LOW-OGESTREL) 0.3-30 MG-MCG tablet Take 1 tablet by mouth daily.  08/01/18  [provider]    Family History Family History  Problem Relation Age of Onset   Diabetes Mother    COPD Mother    Other Father        auto accident   Breast cancer Maternal Grandmother        unsure of age    Social History Social History   Tobacco Use   Smoking status: Never   Smokeless tobacco: Never  Vaping Use   Vaping Use: Never used  Substance Use Topics   Alcohol use: Yes    Comment: social   Drug use: No  Allergies   Neosporin [neomycin-bacitracin zn-polymyx], Zithromax [azithromycin], and Penicillins   Review of Systems Review of Systems: negative unless otherwise stated in HPI.      Physical Exam Triage Vital Signs ED Triage Vitals  Enc Vitals Group     BP 05/09/22 1054 (!) 150/90     Pulse Rate 05/09/22 1054 99     Resp 05/09/22 1054 14     Temp 05/09/22 1054 98.6 F (37 C)     Temp Source 05/09/22 1054 Oral     SpO2 --      Weight 05/09/22 1051 220 lb 0.3 oz (99.8 kg)     Height 05/09/22 1051 5\' 8"  (1.727 m)     Head Circumference --      Peak Flow --      Pain Score 05/09/22 1051 5     Pain Loc --      Pain Edu? --      Excl. in GC? --    No data  found.  Updated Vital Signs BP (!) 150/90 (BP Location: Left Arm)   Pulse 99   Temp 98.6 F (37 C) (Oral)   Resp 14   Ht 5\' 8"  (1.727 m)   Wt 99.8 kg   BMI 33.45 kg/m   Visual Acuity Right Eye Distance:   Left Eye Distance:   Bilateral Distance:    Right Eye Near:   Left Eye Near:    Bilateral Near:     Physical Exam GEN:     alert, non-toxic appearing female in no distress    HENT:  mucus membranes moist, clear nasal discharge EYES:   pupils equal and reactive, no scleral injection or discharge NECK:  normal ROM, no lymphadenopathy,  no meningismus   RESP:  no increased work of breathing, rhonchi, no wheezing, frequent cough CVS:   regular rate and rhythm Skin:   warm and dry, no rash on visible skin    UC Treatments / Results  Labs (all labs ordered are listed, but only abnormal results are displayed) Labs Reviewed - No data to display  EKG   Radiology DG Chest 2 View  Result Date: 05/09/2022 CLINICAL DATA:  Cough and chest congestion for 5 days. EXAM: CHEST - 2 VIEW COMPARISON:  12/13/2021 FINDINGS: The heart size and mediastinal contours are within normal limits. Both lungs are clear. The visualized skeletal structures are unremarkable. IMPRESSION: No active cardiopulmonary disease. Electronically Signed   By: Danae Orleans M.D.   On: 05/09/2022 11:21    Procedures Procedures (including critical care time)  Medications Ordered in UC Medications - No data to display  Initial Impression / Assessment and Plan / UC Course  I have reviewed the triage vital signs and the nursing notes.  Pertinent labs & imaging results that were available during my care of the patient were reviewed by me and considered in my medical decision making (see chart for details).       Pt is a 47 y.o. female who presents for 5 days of cough that is not improving despite OTC treatment. Rachel Huynh is afebrile here. Overall pt is non-toxic appearing, well hydrated, without respiratory  distress. Pulmonary exam is remarkable rhonchi, no wheezing, frequent cough.  Home COVID was negative. Chest xray personally reviewed by me without focal pneumonia, pleural effusion, cardiomegaly or pneumothorax.  History consistent with viral respiratory illness. Treat with steroids. Discussed symptomatic treatment.  Explained lack of efficacy of antibiotics in viral disease however pt requests antibiotics.  Typical  duration of symptoms discussed.   Return and ED precautions given and voiced understanding. Discussed MDM, treatment plan and plan for follow-up with patient who agrees with plan.     Final Clinical Impressions(s) / UC Diagnoses   Final diagnoses:  Respiratory illness     Discharge Instructions      Stop by the pharmacy to pick up your prescriptions.  If not improving after completion of steroids,start the antibiotics as discussed. Continue using your inhaler. Follow up with your primary care provider as needed.     ED Prescriptions     Medication Sig Dispense Auth. Provider   promethazine-dextromethorphan (PROMETHAZINE-DM) 6.25-15 MG/5ML syrup Take 5 mLs by mouth 4 (four) times daily as needed. 118 mL Ariyannah Pauling, DO   predniSONE (DELTASONE) 20 MG tablet Take 2 tablets (40 mg total) by mouth daily for 5 days. 10 tablet Katha Cabal, DO      PDMP not reviewed this encounter.   Katha Cabal, DO 05/09/22 1140

## 2022-05-09 NOTE — Discharge Instructions (Addendum)
Stop by the pharmacy to pick up your prescriptions.  If not improving after completion of steroids,start the antibiotics as discussed. Continue using your inhaler. Follow up with your primary care provider as needed.

## 2022-06-04 ENCOUNTER — Ambulatory Visit
Admission: RE | Admit: 2022-06-04 | Discharge: 2022-06-04 | Disposition: A | Discharge status: Home / Self Care | Payer: BC Managed Care – PPO | Source: Ambulatory Visit | Attending: Obstetrics and Gynecology | Admitting: Obstetrics and Gynecology

## 2022-06-04 DIAGNOSIS — Z1231 Encounter for screening mammogram for malignant neoplasm of breast: Secondary | ICD-10-CM

## 2022-07-27 ENCOUNTER — Encounter: Payer: Self-pay | Admitting: *Deleted

## 2022-07-29 IMAGING — CR DG CHEST 2V
2 series · 2 of 2 positions shown · non-contrast
Comparison: March 19, 19.

CLINICAL DATA: Cough, fever.

EXAM:
CHEST - 2 VIEW

[chest pa]
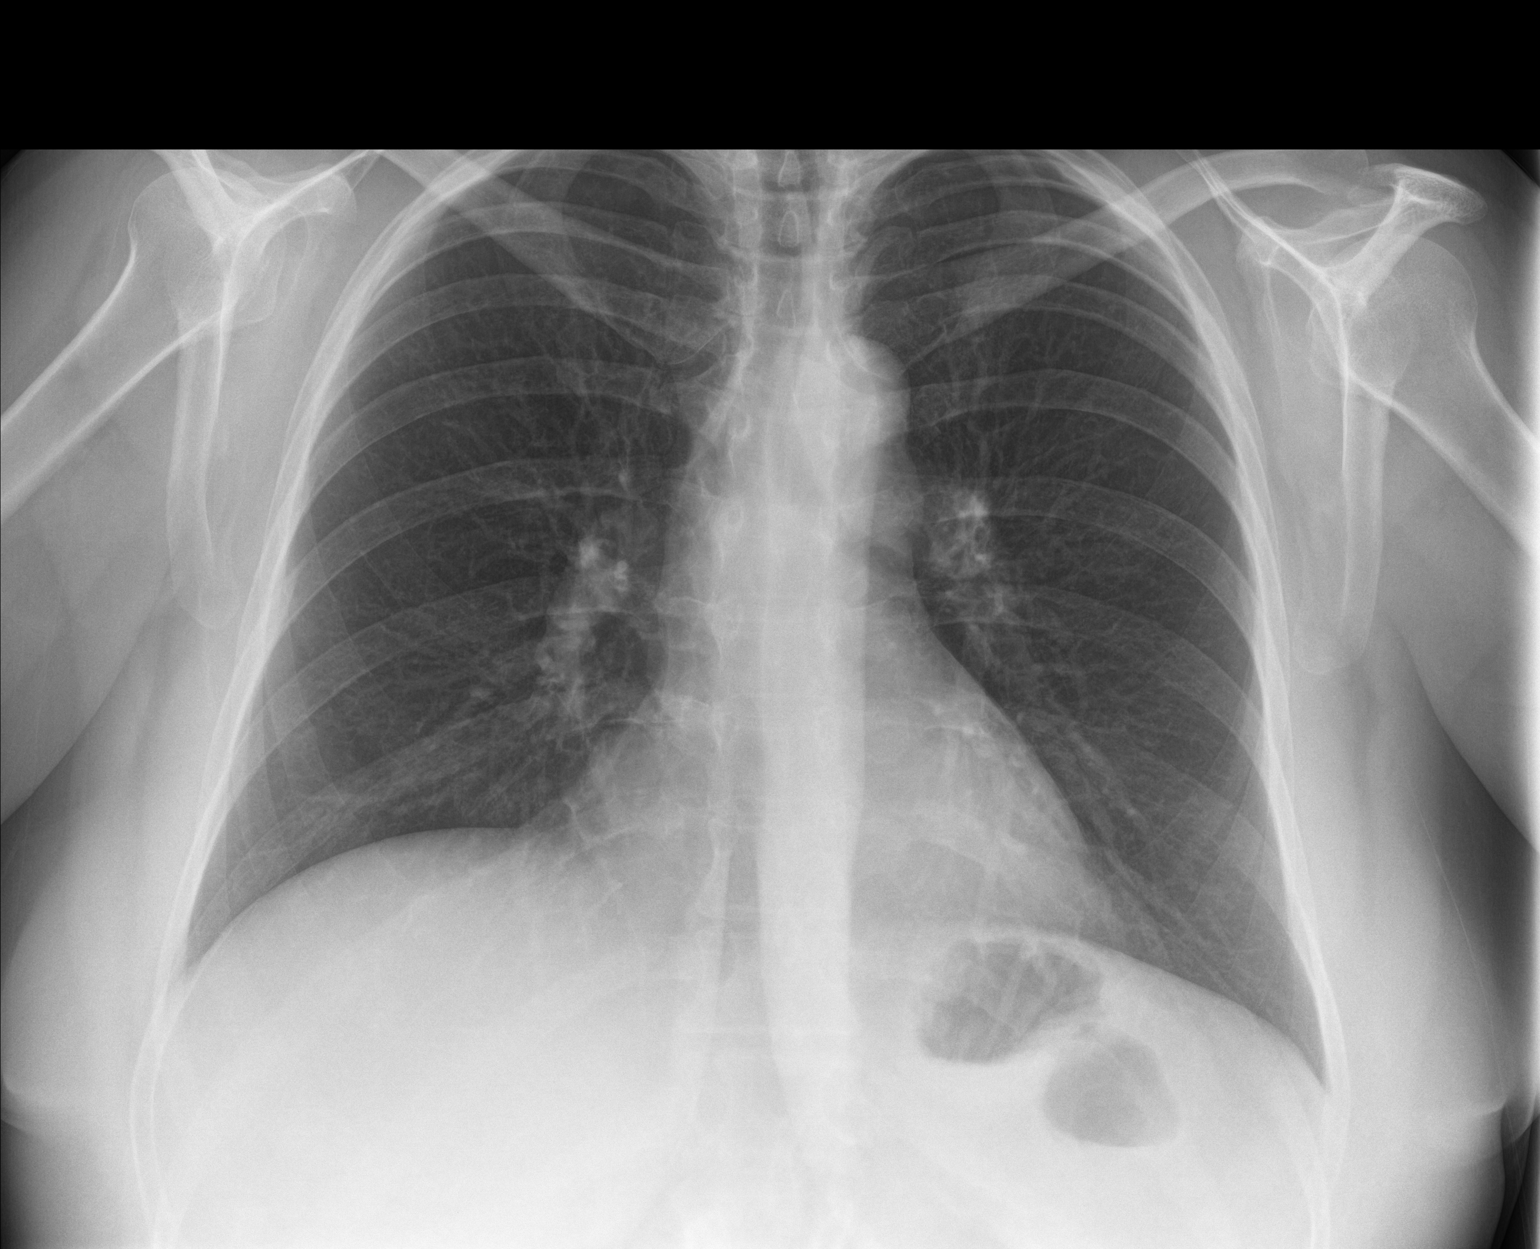

[chest lat]
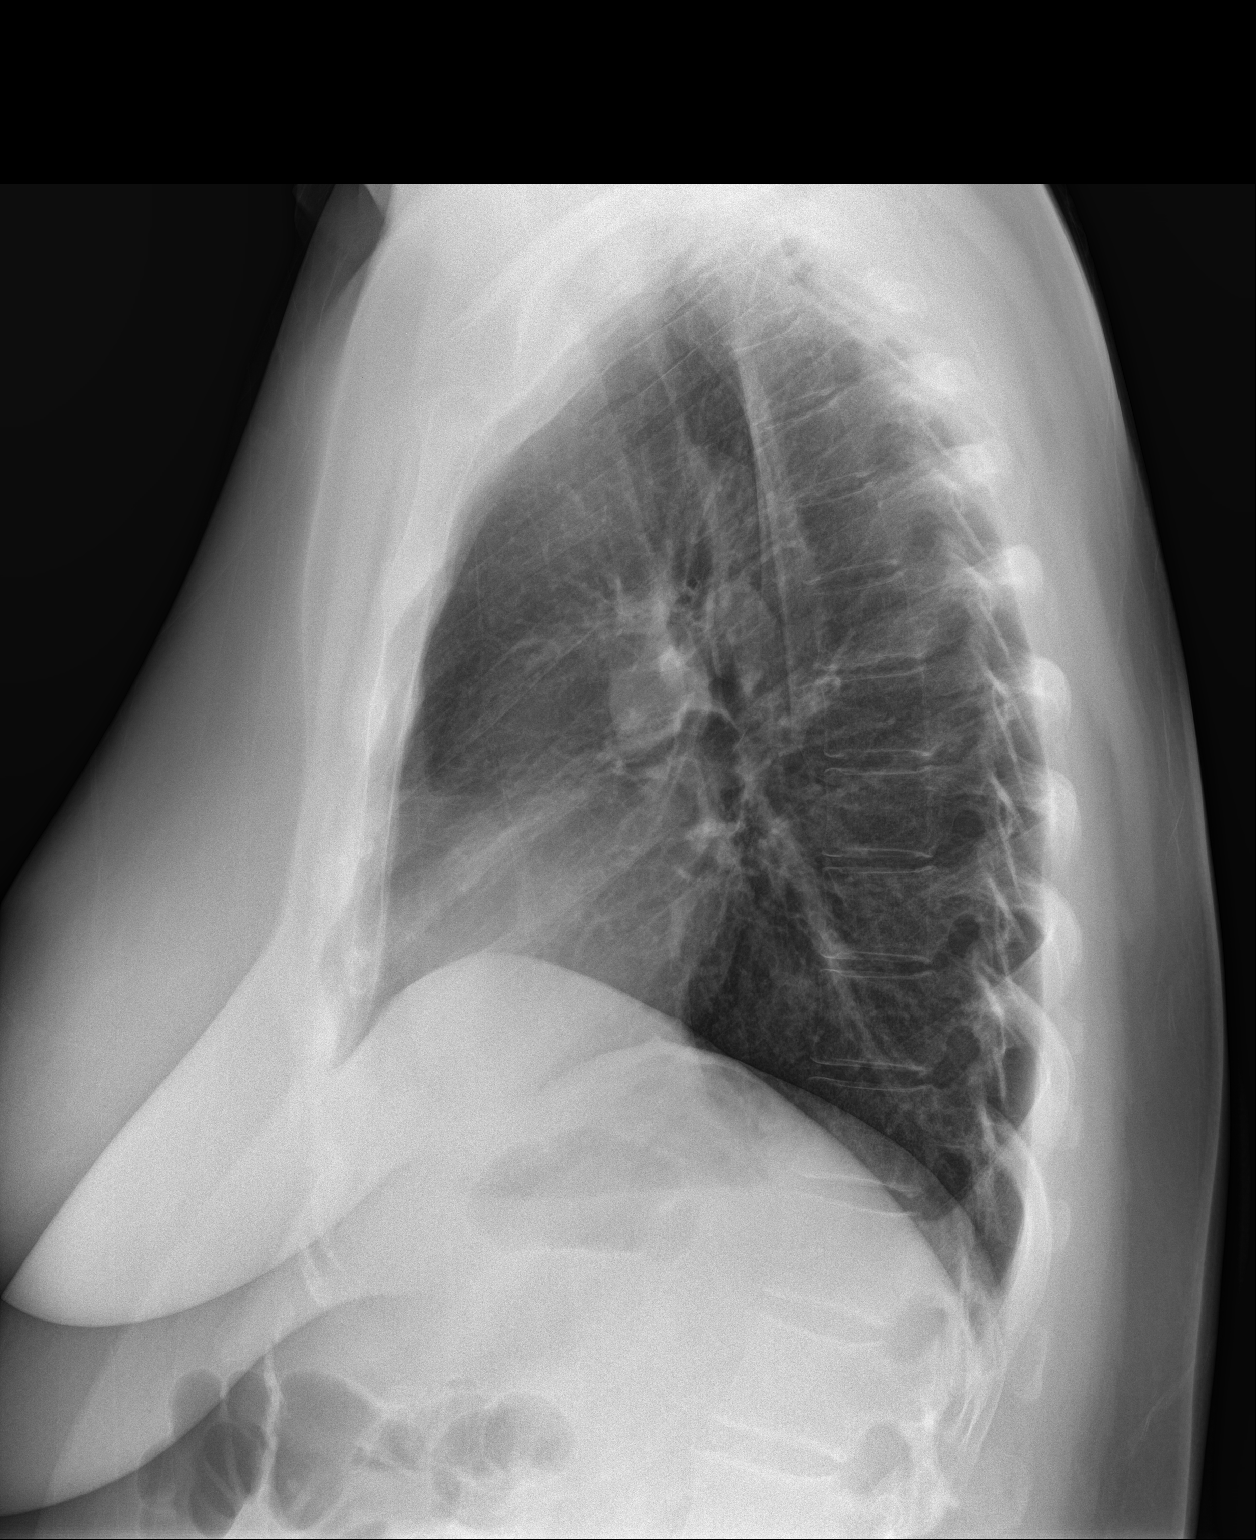

[2 of 2 positions shown; findings below may reference images not displayed]

FINDINGS: The heart size and mediastinal contours are within normal limits.
Both lungs are clear. No visible pleural effusions or pneumothorax.
No acute osseous abnormality. Prior right rib fractures, better
characterized on prior CT chest.
IMPRESSION: No active cardiopulmonary disease.

## 2022-08-06 ENCOUNTER — Telehealth: Payer: Self-pay | Admitting: *Deleted

## 2022-08-06 ENCOUNTER — Other Ambulatory Visit: Payer: Self-pay | Admitting: *Deleted

## 2022-08-06 ENCOUNTER — Telehealth: Payer: Self-pay

## 2022-08-06 DIAGNOSIS — Z1211 Encounter for screening for malignant neoplasm of colon: Secondary | ICD-10-CM

## 2022-08-06 MED ORDER — PEG 3350-KCL-NABCB-NACL-NASULF 236 G PO SOLR: 4000.0000 mL | Freq: Once | ORAL | 0 refills | Status: AC

## 2022-08-06 NOTE — Telephone Encounter (Signed)
Patient called in to schedule colonoscopy.

## 2022-08-06 NOTE — Telephone Encounter (Signed)
Gastroenterology Pre-Procedure Review  Request Date: 09/18/2022 Requesting Physician: Dr. Allegra Lai  PATIENT REVIEW QUESTIONS: The patient responded to the following health history questions as indicated:    1. Are you having any GI issues? no 2. Do you have a personal history of Polyps? no 3. Do you have a family history of Colon Cancer or Polyps? no 4. Diabetes Mellitus? no 5. Joint replacements in the past 12 months?no 6. Major health problems in the past 3 months?no 7. Any artificial heart valves, MVP, or defibrillator?no    MEDICATIONS & ALLERGIES:    Patient reports the following regarding taking any anticoagulation/antiplatelet therapy:   Plavix, Coumadin, Eliquis, Xarelto, Lovenox, Pradaxa, Brilinta, or Effient? no Aspirin? no  Patient confirms/reports the following medications:  Current Outpatient Medications  Medication Sig Dispense Refill   polyethylene glycol (GOLYTELY) 236 g solution Take 4,000 mLs by mouth once for 1 dose. 4000 mL 0   albuterol (VENTOLIN HFA) 108 (90 Base) MCG/ACT inhaler Inhale 1-2 puffs into the lungs every 4 (four) hours as needed for wheezing or shortness of breath. 1 each 0   cefdinir (OMNICEF) 300 MG capsule Take 1 capsule (300 mg total) by mouth 2 (two) times daily. 10 capsule 0   diltiazem (CARDIZEM CD) 120 MG 24 hr capsule Take 120 mg by mouth daily.     famotidine (PEPCID) 20 MG tablet TK 1 T PO BID FOR STOMACH ACID     fluticasone (FLONASE) 50 MCG/ACT nasal spray Place 2 sprays into both nostrils daily. 16 g 0   levalbuterol (XOPENEX HFA) 45 MCG/ACT inhaler Inhale 2 puffs into the lungs 4 (four) times daily. For 5 days then as needed 1 each 0   norethindrone-ethinyl estradiol (JUNEL FE,GILDESS FE,LOESTRIN FE) 1-20 MG-MCG tablet Take 1 tablet by mouth daily.     omeprazole (PRILOSEC) 20 MG capsule Take 1 capsule (20 mg total) by mouth daily. 14 capsule 0   promethazine-dextromethorphan (PROMETHAZINE-DM) 6.25-15 MG/5ML syrup Take 5 mLs by mouth 4  (four) times daily as needed. 118 mL 0   Spacer/Aero-Holding Chambers (AEROCHAMBER MV) inhaler Use as instructed 1 each 1   valsartan-hydrochlorothiazide (DIOVAN-HCT) 160-25 MG tablet Take 1 tablet by mouth daily.     No current facility-administered medications for this visit.    Patient confirms/reports the following allergies:  Allergies  Allergen Reactions   Neosporin [Neomycin-Bacitracin Zn-Polymyx] Hives   Zithromax [Azithromycin] Nausea And Vomiting   Penicillins Rash    No orders of the defined types were placed in this encounter.   AUTHORIZATION INFORMATION Primary Insurance: 1D#: Group #:  Secondary Insurance: 1D#: Group #:  SCHEDULE INFORMATION: Date: 09/18/2022 Time: Location: [facility]

## 2022-08-06 NOTE — Telephone Encounter (Signed)
Colonoscopy 09/18/2022 with Dr Allegra Lai at [facility]

## 2022-08-17 ENCOUNTER — Telehealth: Payer: Self-pay

## 2022-08-17 NOTE — Telephone Encounter (Signed)
Patient left a voicemail because she is going to be on her period when her colonoscopy is schedule. She is wanting to know if she needs to reschedule her procedure

## 2022-08-17 NOTE — Telephone Encounter (Signed)
Spoken to patient. Inform her that it is up to her if she wants to keep her colonoscopy as is. Patient may still have her colonoscopy even though she is on her period. Patient decided to keep it as is.

## 2022-09-17 ENCOUNTER — Encounter: Payer: Self-pay | Admitting: Gastroenterology

## 2022-09-18 ENCOUNTER — Encounter: Payer: Self-pay | Admitting: Gastroenterology

## 2022-09-18 ENCOUNTER — Encounter
Admission: RE | Disposition: A | Discharge status: Home / Self Care | Payer: Self-pay | Source: Home / Self Care | Attending: Gastroenterology

## 2022-09-18 ENCOUNTER — Ambulatory Visit
Admission: RE | Admit: 2022-09-18 | Discharge: 2022-09-18 | Disposition: A | Discharge status: Home / Self Care | Payer: BC Managed Care – PPO | Attending: Gastroenterology | Admitting: Gastroenterology

## 2022-09-18 ENCOUNTER — Ambulatory Visit: Payer: BC Managed Care – PPO | Admitting: Certified Registered"

## 2022-09-18 DIAGNOSIS — K5730 Diverticulosis of large intestine without perforation or abscess without bleeding: Secondary | ICD-10-CM | POA: Diagnosis not present

## 2022-09-18 DIAGNOSIS — I10 Essential (primary) hypertension: Secondary | ICD-10-CM | POA: Diagnosis not present

## 2022-09-18 DIAGNOSIS — K219 Gastro-esophageal reflux disease without esophagitis: Secondary | ICD-10-CM | POA: Diagnosis not present

## 2022-09-18 DIAGNOSIS — K635 Polyp of colon: Secondary | ICD-10-CM

## 2022-09-18 DIAGNOSIS — Z1211 Encounter for screening for malignant neoplasm of colon: Secondary | ICD-10-CM | POA: Diagnosis present

## 2022-09-18 DIAGNOSIS — D124 Benign neoplasm of descending colon: Secondary | ICD-10-CM | POA: Insufficient documentation

## 2022-09-18 HISTORY — PX: POLYPECTOMY: SHX5525

## 2022-09-18 HISTORY — PX: COLONOSCOPY WITH PROPOFOL: SHX5780

## 2022-09-18 LAB — POCT PREGNANCY, URINE: Preg Test, Ur: NEGATIVE

## 2022-09-18 SURGERY — COLONOSCOPY WITH PROPOFOL
Anesthesia: General

## 2022-09-18 MED ORDER — PROPOFOL 10 MG/ML IV BOLUS
INTRAVENOUS | Status: DC | PRN
  Administered 2022-09-18: 30 mg via INTRAVENOUS
  Administered 2022-09-18: 70 mg via INTRAVENOUS
  Administered 2022-09-18: 30 mg via INTRAVENOUS

## 2022-09-18 MED ORDER — PROPOFOL 500 MG/50ML IV EMUL
INTRAVENOUS | Status: DC | PRN
  Administered 2022-09-18: 165 ug/kg/min via INTRAVENOUS

## 2022-09-18 MED ORDER — MIDAZOLAM HCL 2 MG/2ML IJ SOLN
INTRAMUSCULAR | Status: AC
  Filled 2022-09-18: qty 2

## 2022-09-18 MED ORDER — SODIUM CHLORIDE 0.9 % IV SOLN: INTRAVENOUS | Status: DC

## 2022-09-18 MED ORDER — LIDOCAINE HCL (CARDIAC) PF 100 MG/5ML IV SOSY
PREFILLED_SYRINGE | INTRAVENOUS | Status: DC | PRN
  Administered 2022-09-18: 100 mg via INTRAVENOUS

## 2022-09-18 MED ORDER — MIDAZOLAM HCL 2 MG/2ML IJ SOLN
INTRAMUSCULAR | Status: DC | PRN
  Administered 2022-09-18: 2 mg via INTRAVENOUS

## 2022-09-18 NOTE — Op Note (Signed)
[facility] Gastroenterology Patient Name: Rachel Huynh Procedure Date: 09/18/2022 10:58 AM MRN: [id number] Account #: [id number] Date of Birth: December 21, 1975 Admit Type: Outpatient Age: 47 Room: [facility] ENDO ROOM 2 Gender: Female Note Status: Finalized Instrument Name: Prentice Docker [id number] Procedure:             Colonoscopy Indications:           Screening for colorectal malignant neoplasm, This is                         the patient's first colonoscopy Providers:             Toney Reil MD, MD Referring MD:          Blair Heys, MD (Referring MD) Medicines:             General Anesthesia Complications:         No immediate complications. Estimated blood loss: None. Procedure:             Pre-Anesthesia Assessment:                        - Prior to the procedure, a History and Physical was                         performed, and patient medications and allergies were                         reviewed. The patient is competent. The risks and                         benefits of the procedure and the sedation options and                         risks were discussed with the patient. All questions                         were answered and informed consent was obtained.                         Patient identification and proposed procedure were                         verified by the physician, the nurse, the                         anesthesiologist, the anesthetist and the technician                         in the pre-procedure area in the procedure room in the                         endoscopy suite. Mental Status Examination: alert and                         oriented. Airway Examination: normal oropharyngeal                         airway and neck mobility. Respiratory Examination:  clear to auscultation. CV Examination: normal.                         Prophylactic Antibiotics: The patient does not require                         prophylactic  antibiotics. Prior Anticoagulants: The                         patient has taken no anticoagulant or antiplatelet                         agents. ASA Grade Assessment: II - A patient with mild                         systemic disease. After reviewing the risks and                         benefits, the patient was deemed in satisfactory                         condition to undergo the procedure. The anesthesia                         plan was to use general anesthesia. Immediately prior                         to administration of medications, the patient was                         re-assessed for adequacy to receive sedatives. The                         heart rate, respiratory rate, oxygen saturations,                         blood pressure, adequacy of pulmonary ventilation, and                         response to care were monitored throughout the                         procedure. The physical status of the patient was                         re-assessed after the procedure.                        After obtaining informed consent, the colonoscope was                         passed under direct vision. Throughout the procedure,                         the patient's blood pressure, pulse, and oxygen                         saturations were monitored continuously. The  Colonoscope was introduced through the anus and                         advanced to the the cecum, identified by appendiceal                         orifice and ileocecal valve. The colonoscopy was                         performed without difficulty. The patient tolerated                         the procedure well. The quality of the bowel                         preparation was evaluated using the BBPS [facility] Bowel                         Preparation Scale) with scores of: Right Colon = 3,                         Transverse Colon = 3 and Left Colon = 3 (entire mucosa                         seen  well with no residual staining, small fragments                         of stool or opaque liquid). The total BBPS score                         equals 9. The ileocecal valve, appendiceal orifice,                         and rectum were photographed. Findings:      The perianal and digital rectal examinations were normal. Pertinent       negatives include normal sphincter tone and no palpable rectal lesions.      A diminutive polyp was found in the descending colon. The polyp was       sessile. The polyp was removed with a jumbo cold forceps. Resection and       retrieval were complete. Estimated blood loss: none.      A few small-mouthed diverticula were found in the right colon.      The retroflexed view of the distal rectum and anal verge was normal and       showed no anal or rectal abnormalities. Impression:            - One diminutive polyp in the descending colon,                         removed with a jumbo cold forceps. Resected and                         retrieved.                        - Diverticulosis in the right colon.                        -  The distal rectum and anal verge are normal on                         retroflexion view. Recommendation:        - Discharge patient to home (with escort).                        - Resume previous diet today.                        - Continue present medications.                        - Await pathology results.                        - Repeat colonoscopy in 7-10 years for surveillance                         based on pathology results. Procedure Code(s):     --- Professional ---                        [phone], Colonoscopy, flexible; with biopsy, single or                         multiple Diagnosis Code(s):     --- Professional ---                        Z12.11, Encounter for screening for malignant neoplasm                         of colon                        D12.4, Benign neoplasm of descending colon                         K57.30, Diverticulosis of large intestine without                         perforation or abscess without bleeding CPT copyright 2022 American Medical Association. All rights reserved. The codes documented in this report are preliminary and upon coder review may  be revised to meet current compliance requirements. Dr. Libby Maw Toney Reil MD, MD 09/18/2022 11:32:01 AM This report has been signed electronically. Number of Addenda: 0 Note Initiated On: 09/18/2022 10:58 AM Scope Withdrawal Time: 0 hours 8 minutes 41 seconds  Total Procedure Duration: 0 hours 12 minutes 54 seconds  Estimated Blood Loss:  Estimated blood loss: none.      [facility]

## 2022-09-18 NOTE — Anesthesia Preprocedure Evaluation (Addendum)
Anesthesia Evaluation  Patient identified by MRN, date of birth, ID band Patient awake    Reviewed: Allergy & Precautions, NPO status , Patient's Chart, lab work & pertinent test results  Airway Mallampati: III  TM Distance: >3 FB Neck ROM: full    Dental  (+) Chipped, Dental Advidsory Given   Pulmonary asthma    Pulmonary exam normal        Cardiovascular hypertension, On Medications Normal cardiovascular exam     Neuro/Psych negative neurological ROS  negative psych ROS   GI/Hepatic Neg liver ROS,GERD  Medicated,,  Endo/Other  negative endocrine ROS    Renal/GU negative Renal ROS  negative genitourinary   Musculoskeletal   Abdominal   Peds  Hematology negative hematology ROS (+)   Anesthesia Other Findings Past Medical History: No date: Hypertension  Past Surgical History: No date: ANTERIOR CRUCIATE LIGAMENT REPAIR 2001: ANTERIOR CRUCIATE LIGAMENT REPAIR; Left No date: DENTAL SURGERY No date: TONSILLECTOMY  BMI    Body Mass Index: 35.65 kg/m      Reproductive/Obstetrics negative OB ROS                             Anesthesia Physical Anesthesia Plan  ASA: 2  Anesthesia Plan: General   Post-op Pain Management: Minimal or no pain anticipated   Induction: Intravenous  PONV Risk Score and Plan: 3 and Propofol infusion, TIVA and Ondansetron  Airway Management Planned: Nasal Cannula  Additional Equipment: None  Intra-op Plan:   Post-operative Plan:   Informed Consent: I have reviewed the patients History and Physical, chart, labs and discussed the procedure including the risks, benefits and alternatives for the proposed anesthesia with the patient or authorized representative who has indicated his/her understanding and acceptance.     Dental advisory given  Plan Discussed with: CRNA and Surgeon  Anesthesia Plan Comments: (Discussed risks of anesthesia with  patient, including possibility of difficulty with spontaneous ventilation under anesthesia necessitating airway intervention, PONV, and rare risks such as cardiac or respiratory or neurological events, and allergic reactions. Discussed the role of CRNA in patient's perioperative care. Patient understands.)       Anesthesia Quick Evaluation

## 2022-09-18 NOTE — H&P (Signed)
Arlyss Repress, MD [address]  [address]  [place], [place] [address]  Main: [phone]  Fax: [phone] Pager: [phone]  Primary Care Physician:  Blair Heys, MD Primary Gastroenterologist:  Dr. Arlyss Repress  Pre-Procedure History & Physical: HPI:  Rachel Huynh is a 47 y.o. female is here for an colonoscopy.   Past Medical History:  Diagnosis Date   Hypertension     Past Surgical History:  Procedure Laterality Date   ANTERIOR CRUCIATE LIGAMENT REPAIR     ANTERIOR CRUCIATE LIGAMENT REPAIR Left 2001   DENTAL SURGERY     TONSILLECTOMY      Prior to Admission medications   Medication Sig Start Date End Date Taking? Authorizing Provider  norethindrone-ethinyl estradiol (JUNEL FE,GILDESS FE,LOESTRIN FE) 1-20 MG-MCG tablet Take 1 tablet by mouth daily.   Yes [provider]  valsartan-hydrochlorothiazide (DIOVAN-HCT) 160-25 MG tablet Take 1 tablet by mouth daily.   Yes [provider]  albuterol (VENTOLIN HFA) 108 (90 Base) MCG/ACT inhaler Inhale 1-2 puffs into the lungs every 4 (four) hours as needed for wheezing or shortness of breath. 11/07/21   Domenick Gong, MD  cefdinir (OMNICEF) 300 MG capsule Take 1 capsule (300 mg total) by mouth 2 (two) times daily. Patient not taking: Reported on 09/18/2022 05/14/22   Katha Cabal, DO  diltiazem (CARDIZEM CD) 120 MG 24 hr capsule Take 120 mg by mouth daily. 10/20/21   [provider]  famotidine (PEPCID) 20 MG tablet TK 1 T PO BID FOR STOMACH ACID 06/16/18   [provider]  fluticasone (FLONASE) 50 MCG/ACT nasal spray Place 2 sprays into both nostrils daily. Patient not taking: Reported on 09/18/2022 11/07/21   Domenick Gong, MD  levalbuterol [facility] HFA) 45 MCG/ACT inhaler Inhale 2 puffs into the lungs 4 (four) times daily. For 5 days then as needed 12/13/21   Rodriguez-Southworth, Nettie Elm, PA-C  omeprazole (PRILOSEC) 20 MG capsule Take 1 capsule (20 mg total) by mouth daily.  07/12/20   Rushie Chestnut, PA-C  promethazine-dextromethorphan (PROMETHAZINE-DM) 6.25-15 MG/5ML syrup Take 5 mLs by mouth 4 (four) times daily as needed. 05/09/22   Katha Cabal, DO  Spacer/Aero-Holding Chambers (AEROCHAMBER MV) inhaler Use as instructed 11/07/21   Domenick Gong, MD  norgestrel-ethinyl estradiol (LOW-OGESTREL) 0.3-30 MG-MCG tablet Take 1 tablet by mouth daily.  08/01/18  [provider]    Allergies as of 08/07/2022 - Review Complete 05/09/2022  Allergen Reaction Noted   Neosporin [neomycin-bacitracin zn-polymyx] Hives 03/09/2013   Zithromax [azithromycin] Nausea And Vomiting 03/09/2013   Penicillins Rash 03/09/2013    Family History  Problem Relation Age of Onset   Diabetes Mother    COPD Mother    Other Father        auto accident   Breast cancer Maternal Grandmother        unsure of age    Social History   Socioeconomic History   Marital status: Married    Spouse name: Not on file   Number of children: Not on file   Years of education: Not on file   Highest education level: Not on file  Occupational History   Not on file  Tobacco Use   Smoking status: Never   Smokeless tobacco: Never  Vaping Use   Vaping status: Never Used  Substance and Sexual Activity   Alcohol use: Yes    Comment: social   Drug use: No   Sexual activity: Not on file  Other Topics Concern   Not on  file  Social History Narrative   Not on file   Social Determinants of Health   Financial Resource Strain: Not on file  Food Insecurity: Not on file  Transportation Needs: Not on file  Physical Activity: Not on file  Stress: Not on file  Social Connections: Not on file  Intimate Partner Violence: Not on file    Review of Systems: See HPI, otherwise negative ROS  Physical Exam: BP (!) 158/89   Pulse 84   Temp (!) 97.1 F (36.2 C) (Temporal)   Resp 18   Ht 5\' 8"  (1.727 m)   Wt 106.4 kg   LMP 09/15/2022 Comment: Pregnancy test negative  SpO2 100%   BMI  35.65 kg/m  General:   Alert,  pleasant and cooperative in NAD Head:  Normocephalic and atraumatic. Neck:  Supple; no masses or thyromegaly. Lungs:  Clear throughout to auscultation.    Heart:  Regular rate and rhythm. Abdomen:  Soft, nontender and nondistended. Normal bowel sounds, without guarding, and without rebound.   Neurologic:  Alert and  oriented x4;  grossly normal neurologically.  Impression/Plan: Rachel Huynh is here for an colonoscopy to be performed for colon cancer screening  Risks, benefits, limitations, and alternatives regarding  colonoscopy have been reviewed with the patient.  Questions have been answered.  All parties agreeable.   Lannette Donath, MD  09/18/2022, 11:02 AM

## 2022-09-18 NOTE — Transfer of Care (Signed)
Immediate Anesthesia Transfer of Care Note  Patient: Rachel Huynh  Procedure(s) Performed: COLONOSCOPY WITH PROPOFOL POLYPECTOMY  Patient Location: Endoscopy Unit  Anesthesia Type:General  Level of Consciousness: awake, drowsy, and patient cooperative  Airway & Oxygen Therapy: Patient Spontanous Breathing  Post-op Assessment: Report given to RN and Post -op Vital signs reviewed and stable  Post vital signs: Reviewed and stable  Last Vitals:  Vitals Value Taken Time  BP 104/67 09/18/22 1137  Temp 36.1 C 09/18/22 1135  Pulse 96 09/18/22 1137  Resp 18 09/18/22 1135  SpO2 100 % 09/18/22 1137  Vitals shown include unfiled device data.  Last Pain:  Vitals:   09/18/22 1135  TempSrc: Temporal  PainSc: Asleep         Complications: No notable events documented.

## 2022-09-18 NOTE — Anesthesia Procedure Notes (Signed)
Procedure Name: General with mask airway Date/Time: 09/18/2022 11:23 AM  Performed by: Mohammed Kindle, CRNAPre-anesthesia Checklist: Emergency Drugs available, Suction available, Patient being monitored and Patient identified Patient Re-evaluated:Patient Re-evaluated prior to induction Oxygen Delivery Method: Simple face mask Induction Type: IV induction Placement Confirmation: positive ETCO2, CO2 detector and breath sounds checked- equal and bilateral Dental Injury: Teeth and Oropharynx as per pre-operative assessment

## 2022-09-19 NOTE — Anesthesia Postprocedure Evaluation (Signed)
Anesthesia Post Note  Patient: Rachel Huynh  Procedure(s) Performed: COLONOSCOPY WITH PROPOFOL POLYPECTOMY  Patient location during evaluation: PACU Anesthesia Type: General Level of consciousness: awake and alert Pain management: pain level controlled Vital Signs Assessment: post-procedure vital signs reviewed and stable Respiratory status: spontaneous breathing, nonlabored ventilation, respiratory function stable and patient connected to nasal cannula oxygen Cardiovascular status: blood pressure returned to baseline and stable Postop Assessment: no apparent nausea or vomiting Anesthetic complications: no   No notable events documented.   Last Vitals:  Vitals:   09/18/22 1145 09/18/22 1155  BP: 125/88 121/81  Pulse: 82   Resp: 18   Temp:    SpO2: 100% 100%    Last Pain:  Vitals:   09/18/22 1155  TempSrc:   PainSc: 0-No pain                 Stephanie Coup

## 2022-09-21 ENCOUNTER — Encounter: Payer: Self-pay | Admitting: Gastroenterology

## 2022-09-21 LAB — SURGICAL PATHOLOGY

## 2022-10-30 ENCOUNTER — Ambulatory Visit
Admission: RE | Admit: 2022-10-30 | Discharge: 2022-10-30 | Disposition: A | Discharge status: Home / Self Care | Payer: BC Managed Care – PPO | Source: Ambulatory Visit | Attending: Physician Assistant | Admitting: Physician Assistant

## 2022-10-30 VITALS — BP 152/97 | HR 97 | Temp 98.4°F | Resp 14 | Ht 68.0 in | Wt 234.6 lb

## 2022-10-30 DIAGNOSIS — L03113 Cellulitis of right upper limb: Secondary | ICD-10-CM | POA: Diagnosis not present

## 2022-10-30 DIAGNOSIS — W5503XA Scratched by cat, initial encounter: Secondary | ICD-10-CM | POA: Diagnosis not present

## 2022-10-30 MED ORDER — DOXYCYCLINE HYCLATE 100 MG PO CAPS: 100.0000 mg | ORAL_CAPSULE | Freq: Two times a day (BID) | ORAL | 0 refills | Status: AC

## 2022-10-30 MED ORDER — MUPIROCIN 2 % EX OINT: 1.0000 | TOPICAL_OINTMENT | Freq: Two times a day (BID) | CUTANEOUS | 0 refills | Status: AC

## 2022-10-30 NOTE — ED Provider Notes (Signed)
[facility]    CSN: [id number] Arrival date & time: 10/30/22  1606      History   Chief Complaint Chief Complaint  Patient presents with   Cellulitis    Right arm    HPI Rachel Huynh is a 47 y.o. female presenting for suspected infection of the right arm.  She reports that she had a new kitten couple months ago.  There is an area of the dorsal right wrist that has been present since she was scratched by her new kitten a couple months ago.  She reports that the area will scab over and she picks at her skin.  She has had difficulty getting it to heal because she continually picks at her skin.  Has tried wrapping the area with an Ace wrap so that she does not pick at the skin that she continues to do so.  About 2 days ago she was scratched near the right elbow.  She reports some redness and swelling of this area.  The other sites painful as well.  She has been cleaning the areas with alcohol and hydrogen peroxide.  No associated fever or lymphadenopathy.  Denies fatigue, chills, or systemic symptoms.  HPI  Past Medical History:  Diagnosis Date   Hypertension     Patient Active Problem List   Diagnosis Date Noted   Colon cancer screening 09/18/2022   Polyp of descending colon 09/18/2022    Past Surgical History:  Procedure Laterality Date   ANTERIOR CRUCIATE LIGAMENT REPAIR     ANTERIOR CRUCIATE LIGAMENT REPAIR Left 2001   COLONOSCOPY WITH PROPOFOL N/A 09/18/2022   Procedure: COLONOSCOPY WITH PROPOFOL;  Surgeon: Toney Reil, MD;  Location: [facility] ENDOSCOPY;  Service: Gastroenterology;  Laterality: N/A;   DENTAL SURGERY     POLYPECTOMY  09/18/2022   Procedure: POLYPECTOMY;  Surgeon: Toney Reil, MD;  Location: [facility] ENDOSCOPY;  Service: Gastroenterology;;   TONSILLECTOMY      OB History   No obstetric history on file.      Home Medications    Prior to Admission medications   Medication Sig Start Date End Date Taking? Authorizing Provider   doxycycline (VIBRAMYCIN) 100 MG capsule Take 1 capsule (100 mg total) by mouth 2 (two) times daily for 7 days. 10/30/22 11/06/22 Yes Shirlee Latch, PA-C  mupirocin ointment (BACTROBAN) 2 % Apply 1 Application topically 2 (two) times daily. 10/30/22  Yes Eusebio Friendly B, PA-C  valsartan-hydrochlorothiazide (DIOVAN-HCT) 160-25 MG tablet Take 1 tablet by mouth daily.   Yes [provider]  albuterol (VENTOLIN HFA) 108 (90 Base) MCG/ACT inhaler Inhale 1-2 puffs into the lungs every 4 (four) hours as needed for wheezing or shortness of breath. 11/07/21   Domenick Gong, MD  diltiazem (CARDIZEM CD) 120 MG 24 hr capsule Take 120 mg by mouth daily. 10/20/21   [provider]  famotidine (PEPCID) 20 MG tablet TK 1 T PO BID FOR STOMACH ACID 06/16/18   [provider]  levalbuterol (XOPENEX HFA) 45 MCG/ACT inhaler Inhale 2 puffs into the lungs 4 (four) times daily. For 5 days then as needed 12/13/21   Rodriguez-Southworth, Nettie Elm, PA-C  norethindrone-ethinyl estradiol (JUNEL FE,GILDESS FE,LOESTRIN FE) 1-20 MG-MCG tablet Take 1 tablet by mouth daily.    [provider]  omeprazole (PRILOSEC) 20 MG capsule Take 1 capsule (20 mg total) by mouth daily. 07/12/20   Rushie Chestnut, PA-C  Spacer/Aero-Holding Chambers (AEROCHAMBER MV) inhaler Use as instructed 11/07/21   Domenick Gong, MD  norgestrel-ethinyl estradiol (LOW-OGESTREL) 0.3-30 MG-MCG tablet Take 1 tablet by mouth daily.  08/01/18  [provider]    Family History Family History  Problem Relation Age of Onset   Diabetes Mother    COPD Mother    Other Father        auto accident   Breast cancer Maternal Grandmother        unsure of age    Social History Social History   Tobacco Use   Smoking status: Never   Smokeless tobacco: Never  Vaping Use   Vaping status: Never Used  Substance Use Topics   Alcohol use: Yes    Comment: social   Drug use: No     Allergies   Neosporin  [neomycin-bacitracin zn-polymyx], Zithromax [azithromycin], and Penicillins   Review of Systems Review of Systems  Constitutional:  Negative for fatigue and fever.  Musculoskeletal:  Negative for arthralgias and joint swelling.  Skin:  Positive for color change and wound.  Neurological:  Negative for weakness and numbness.  Hematological:  Negative for adenopathy.     Physical Exam Triage Vital Signs ED Triage Vitals  Encounter Vitals Group     BP 10/30/22 1628 (!) 152/97     Systolic BP Percentile --      Diastolic BP Percentile --      Pulse Rate 10/30/22 1628 97     Resp 10/30/22 1628 14     Temp 10/30/22 1628 98.4 F (36.9 C)     Temp Source 10/30/22 1628 Oral     SpO2 10/30/22 1628 98 %     Weight 10/30/22 1623 234 lb 9.1 oz (106.4 kg)     Height 10/30/22 1623 5\' 8"  (1.727 m)     Head Circumference --      Peak Flow --      Pain Score 10/30/22 1623 4     Pain Loc --      Pain Education --      Exclude from Growth Chart --    No data found.  Updated Vital Signs BP (!) 152/97 (BP Location: Left Arm)   Pulse 97   Temp 98.4 F (36.9 C) (Oral)   Resp 14   Ht 5\' 8"  (1.727 m)   Wt 234 lb 9.1 oz (106.4 kg)   SpO2 98%   BMI 35.67 kg/m      Physical Exam Vitals and nursing note reviewed.  Constitutional:      General: She is not in acute distress.    Appearance: Normal appearance. She is not ill-appearing or toxic-appearing.  HENT:     Head: Normocephalic and atraumatic.  Eyes:     General: No scleral icterus.       Right eye: No discharge.        Left eye: No discharge.     Conjunctiva/sclera: Conjunctivae normal.  Cardiovascular:     Rate and Rhythm: Normal rate and regular rhythm.  Pulmonary:     Effort: Pulmonary effort is normal. No respiratory distress.  Musculoskeletal:     Cervical back: Neck supple.  Skin:    General: Skin is dry.     Findings: Erythema and lesion present.     Comments: See images included in chart. There is an abrasion with  multiple punctures/tiny open wounds of the dorsal right wrist with surrounding erythema and TTP. Superficial abrasions near the right elbow with mild swelling and erythema-also TTP.  Neurological:     General: No focal deficit present.  Mental Status: She is alert. Mental status is at baseline.     Motor: No weakness.     Gait: Gait normal.  Psychiatric:        Mood and Affect: Mood normal.      UC Treatments / Results  Labs (all labs ordered are listed, but only abnormal results are displayed) Labs Reviewed - No data to display  EKG   Radiology No results found.  Procedures Procedures (including critical care time)  Medications Ordered in UC Medications - No data to display  Initial Impression / Assessment and Plan / UC Course  I have reviewed the triage vital signs and the nursing notes.  Pertinent labs & imaging results that were available during my care of the patient were reviewed by me and considered in my medical decision making (see chart for details).   47 year old female presents for multiple abrasions and areas of swelling, redness and pain of the right arm secondary to cat scratches.  She is afebrile and overall well-appearing.  Denies systemic symptoms which could be indicative cat scratch fever.  See images included in chart which do suggest mild cellulitis.  Patient has allergy to penicillins, Zithromax.  Will treat at this time with doxycycline and mupirocin ointment.  Advised to clean the area with soap and water only and then apply the ointment.  Patient given Coban and discussed that it is imperative she not pick and open her wounds.  Reviewed returning if fever or worsening symptoms.  Follow-up with PCP if area is not healing.  Final Clinical Impressions(s) / UC Diagnoses   Final diagnoses:  Cellulitis of right arm  Cat scratch     Discharge Instructions      -Clean the area with soap and water in the shower and use the antibiotic ointment.  Try  to keep the area bandaged and do not pick at it. - Start the antibiotic for infection. - If you feel that the redness and swelling is worsening or you develop a fever, swollen lymph nodes need to be seen again right away.    ED Prescriptions     Medication Sig Dispense Auth. Provider   doxycycline (VIBRAMYCIN) 100 MG capsule Take 1 capsule (100 mg total) by mouth 2 (two) times daily for 7 days. 14 capsule Eusebio Friendly B, PA-C   mupirocin ointment (BACTROBAN) 2 % Apply 1 Application topically 2 (two) times daily. 22 g Shirlee Latch, PA-C      PDMP not reviewed this encounter.   Shirlee Latch, PA-C 10/30/22 1701

## 2022-10-30 NOTE — ED Triage Notes (Signed)
Patient states that they got a new kitten and 2 months ago her husband was shinning a red light for the kitten to play with and the kitten ran across her right forearm and scratched her right forearm.  Patient also states she got another scratch to her right elbow 2 days ago.  Patient has redness, swelling and pain at the site.

## 2022-10-30 NOTE — Discharge Instructions (Addendum)
-  Clean the area with soap and water in the shower and use the antibiotic ointment.  Try to keep the area bandaged and do not pick at it. - Start the antibiotic for infection. - If you feel that the redness and swelling is worsening or you develop a fever, swollen lymph nodes need to be seen again right away.

## 2022-12-26 ENCOUNTER — Ambulatory Visit
Admission: EM | Admit: 2022-12-26 | Discharge: 2022-12-26 | Disposition: A | Discharge status: Home / Self Care | Payer: BC Managed Care – PPO | Attending: Physician Assistant | Admitting: Physician Assistant

## 2022-12-26 DIAGNOSIS — R300 Dysuria: Secondary | ICD-10-CM | POA: Insufficient documentation

## 2022-12-26 DIAGNOSIS — N3001 Acute cystitis with hematuria: Secondary | ICD-10-CM | POA: Insufficient documentation

## 2022-12-26 DIAGNOSIS — T3695XA Adverse effect of unspecified systemic antibiotic, initial encounter: Secondary | ICD-10-CM | POA: Diagnosis present

## 2022-12-26 DIAGNOSIS — B379 Candidiasis, unspecified: Secondary | ICD-10-CM | POA: Insufficient documentation

## 2022-12-26 LAB — URINALYSIS, W/ REFLEX TO CULTURE (INFECTION SUSPECTED)
Bilirubin Urine: NEGATIVE
Glucose, UA: NEGATIVE mg/dL
Ketones, ur: NEGATIVE mg/dL
Nitrite: NEGATIVE
Protein, ur: >300 mg/dL — AB
Specific Gravity, Urine: >1.03 — ABNORMAL HIGH (ref 1.005–1.030)
WBC, UA: >50 WBC/hpf (ref 0–5)
pH: 5.5 (ref 5.0–8.0)

## 2022-12-26 MED ORDER — CIPROFLOXACIN HCL 250 MG PO TABS: 250.0000 mg | ORAL_TABLET | Freq: Two times a day (BID) | ORAL | 0 refills | Status: AC

## 2022-12-26 MED ORDER — FLUCONAZOLE 150 MG PO TABS: ORAL_TABLET | ORAL | 0 refills | Status: AC

## 2022-12-26 MED ORDER — PHENAZOPYRIDINE HCL 200 MG PO TABS: 200.0000 mg | ORAL_TABLET | Freq: Three times a day (TID) | ORAL | 0 refills | Status: AC

## 2022-12-26 NOTE — ED Provider Notes (Signed)
[facility]    CSN: [id number] Arrival date & time: 12/26/22  1015      History   Chief Complaint No chief complaint on file.   HPI Rachel Huynh is a 47 y.o. female presenting for onset of dysuria, urinary frequency and urgency as well as suprapubic discomfort and  hematuria x 4 days.  Patient has history of UTIs and reports 3-4 this year.  Presently denies any fever, fatigue, chills, flank pain or back pain.   No vaginal discharge and does not report any concern for STIs.  Has not taken any OTC meds for symptoms today.  No other complaints.  HPI  Past Medical History:  Diagnosis Date   Hypertension     Patient Active Problem List   Diagnosis Date Noted   Colon cancer screening 09/18/2022   Polyp of descending colon 09/18/2022     Past Surgical History:  Procedure Laterality Date   ANTERIOR CRUCIATE LIGAMENT REPAIR     ANTERIOR CRUCIATE LIGAMENT REPAIR Left 2001   COLONOSCOPY WITH PROPOFOL N/A 09/18/2022   Procedure: COLONOSCOPY WITH PROPOFOL;  Surgeon: Toney Reil, MD;  Location: [facility] ENDOSCOPY;  Service: Gastroenterology;  Laterality: N/A;   DENTAL SURGERY     POLYPECTOMY  09/18/2022   Procedure: POLYPECTOMY;  Surgeon: Toney Reil, MD;  Location: [facility] ENDOSCOPY;  Service: Gastroenterology;;   TONSILLECTOMY      OB History   No obstetric history on file.      Home Medications    Prior to Admission medications   Medication Sig Start Date End Date Taking? Authorizing Provider  ciprofloxacin (CIPRO) 250 MG tablet Take 1 tablet (250 mg total) by mouth every 12 (twelve) hours for 7 days. 12/26/22 01/02/23 Yes Shirlee Latch, PA-C  phenazopyridine (PYRIDIUM) 200 MG tablet Take 1 tablet (200 mg total) by mouth 3 (three) times daily. 12/26/22  Yes Eusebio Friendly B, PA-C  albuterol (VENTOLIN HFA) 108 (90 Base) MCG/ACT inhaler Inhale 1-2 puffs into the lungs every 4 (four) hours as needed for wheezing or shortness of breath. 11/07/21   Domenick Gong, MD  diltiazem (CARDIZEM CD) 120 MG 24 hr capsule Take 120 mg by mouth daily. 10/20/21   [provider]  famotidine (PEPCID) 20 MG tablet TK 1 T PO BID FOR STOMACH ACID 06/16/18   [provider]  levalbuterol (XOPENEX HFA) 45 MCG/ACT inhaler Inhale 2 puffs into the lungs 4 (four) times daily. For 5 days then as needed 12/13/21   Rodriguez-Southworth, Nettie Elm, PA-C  mupirocin ointment (BACTROBAN) 2 % Apply 1 Application topically 2 (two) times daily. 10/30/22   Shirlee Latch, PA-C  norethindrone-ethinyl estradiol (JUNEL FE,GILDESS FE,LOESTRIN FE) 1-20 MG-MCG tablet Take 1 tablet by mouth daily.    [provider]  omeprazole (PRILOSEC) 20 MG capsule Take 1 capsule (20 mg total) by mouth daily. 07/12/20   Rushie Chestnut, PA-C  Spacer/Aero-Holding Chambers (AEROCHAMBER MV) inhaler Use as instructed 11/07/21   Domenick Gong, MD  valsartan-hydrochlorothiazide (DIOVAN-HCT) 160-25 MG tablet Take 1 tablet by mouth daily.    [provider]  norgestrel-ethinyl estradiol (LOW-OGESTREL) 0.3-30 MG-MCG tablet Take 1 tablet by mouth daily.  08/01/18  [provider]    Family History Family History  Problem Relation Age of Onset   Diabetes Mother    COPD Mother    Other Father        auto accident   Breast cancer Maternal Grandmother        unsure of  age    Social History Social History   Tobacco Use   Smoking status: Never   Smokeless tobacco: Never  Vaping Use   Vaping status: Never Used  Substance Use Topics   Alcohol use: Yes    Comment: social   Drug use: No     Allergies   Neosporin [neomycin-bacitracin zn-polymyx], Zithromax [azithromycin], and Penicillins   Review of Systems Review of Systems  Constitutional:  Negative for chills, fatigue and fever.  Gastrointestinal:  Positive for abdominal pain. Negative for diarrhea, nausea and vomiting.  Genitourinary:  Positive for dysuria, frequency, hematuria and urgency. Negative  for decreased urine volume, flank pain, pelvic pain, vaginal bleeding, vaginal discharge and vaginal pain.  Musculoskeletal:  Negative for back pain.  Skin:  Negative for rash.     Physical Exam Triage Vital Signs  No data found.  Updated Vital Signs BP 137/80 (BP Location: Left Arm)   Pulse 90   Temp 97.6 F (36.4 C) Comment: drank cold water  Resp 16   Ht 5\' 8"  (1.727 m)   Wt 240 lb (108.9 kg)   SpO2 100%   BMI 36.49 kg/m      Physical Exam Vitals and nursing note reviewed.  Constitutional:      General: She is not in acute distress.    Appearance: Normal appearance. She is not ill-appearing or toxic-appearing.  HENT:     Head: Normocephalic and atraumatic.  Eyes:     General: No scleral icterus.       Right eye: No discharge.        Left eye: No discharge.     Conjunctiva/sclera: Conjunctivae normal.  Cardiovascular:     Rate and Rhythm: Normal rate and regular rhythm.     Heart sounds: Normal heart sounds.  Pulmonary:     Effort: Pulmonary effort is normal. No respiratory distress.     Breath sounds: Normal breath sounds.  Abdominal:     Palpations: Abdomen is soft.     Tenderness: There is abdominal tenderness (suprapubic). There is no right CVA tenderness or left CVA tenderness.  Musculoskeletal:     Cervical back: Neck supple.  Skin:    General: Skin is dry.  Neurological:     General: No focal deficit present.     Mental Status: She is alert. Mental status is at baseline.     Motor: No weakness.     Gait: Gait normal.  Psychiatric:        Mood and Affect: Mood normal.        Behavior: Behavior normal.        Thought Content: Thought content normal.      UC Treatments / Results  Labs (all labs ordered are listed, but only abnormal results are displayed) Labs Reviewed  URINALYSIS, W/ REFLEX TO CULTURE (INFECTION SUSPECTED) - Abnormal; Notable for the following components:      Result Value   APPearance CLOUDY (*)    Specific Gravity, Urine  >1.030 (*)    Hgb urine dipstick MODERATE (*)    Protein, ur >300 (*)    Leukocytes,Ua MODERATE (*)    Bacteria, UA MANY (*)    All other components within normal limits  URINE CULTURE    EKG   Radiology No results found.  Procedures Procedures (including critical care time)  Medications Ordered in UC Medications - No data to display   Initial Impression / Assessment and Plan / UC Course  I have reviewed the triage vital  signs and the nursing notes.  Pertinent labs & imaging results that were available during my care of the patient were reviewed by me and considered in my medical decision making (see chart for details).   47 year old female presenting for onset of dysuria, urinary frequency and urgency as well as lower abdominal discomfort/pain x 4 days.  History of UTIs.    The UA today shows cloudy urine, greater than 1.030 specific gravity, hazy urine, moderate hemoglobin, protein, moderate leukocytes, and many bacteria.  We will send urine for culture and treat for suspected UTI.  Patient requests Cipro. Also asks for diflucan for possible antibiotic induced yeast infection.  Treating with Cipro.   Advised increased rest and fluids.  Also sent Pyridium.  Advised we will change her antibiotic if needed based on her culture result in a couple of days.  Thoroughly reviewed return and ED precautions.  Advised following with PCP.   Final Clinical Impressions(s) / UC Diagnoses   Final diagnoses:  Acute cystitis with hematuria  Dysuria  Antibiotic-induced yeast infection     Discharge Instructions      UTI: Based on either symptoms or urinalysis, you may have a urinary tract infection. We will send the urine for culture and call with results in a few days. Begin antibiotics at this time. Your symptoms should be much improved over the next 2-3 days. Increase rest and fluid intake. If for some reason symptoms are worsening or not improving after a couple of days or the urine  culture determines the antibiotics you are taking will not treat the infection, the antibiotics may be changed. Return or go to ER for fever, back pain, worsening urinary pain, discharge, increased blood in urine. May take Tylenol or Motrin OTC for pain relief or consider AZO if no contraindications       ED Prescriptions     Medication Sig Dispense Auth. Provider   ciprofloxacin (CIPRO) 250 MG tablet Take 1 tablet (250 mg total) by mouth every 12 (twelve) hours for 7 days. 14 tablet Eusebio Friendly B, PA-C   phenazopyridine (PYRIDIUM) 200 MG tablet Take 1 tablet (200 mg total) by mouth 3 (three) times daily. 6 tablet Gareth Morgan      PDMP not reviewed this encounter.      Shirlee Latch, PA-C 12/26/22 1119

## 2022-12-26 NOTE — Discharge Instructions (Addendum)

## 2022-12-26 NOTE — ED Triage Notes (Signed)
Patient presents with symptoms of frequent and urge to urinate, blood in urine x day 4. No treatment used.

## 2022-12-28 LAB — URINE CULTURE: Culture: >=100000 — AB

## 2023-05-12 ENCOUNTER — Other Ambulatory Visit: Payer: Self-pay | Admitting: Obstetrics and Gynecology

## 2023-05-12 DIAGNOSIS — Z1231 Encounter for screening mammogram for malignant neoplasm of breast: Secondary | ICD-10-CM

## 2023-06-14 ENCOUNTER — Ambulatory Visit
Admission: RE | Admit: 2023-06-14 | Discharge: 2023-06-14 | Disposition: A | Discharge status: Home / Self Care | Source: Ambulatory Visit | Attending: Obstetrics and Gynecology | Admitting: Obstetrics and Gynecology

## 2023-06-14 DIAGNOSIS — Z1231 Encounter for screening mammogram for malignant neoplasm of breast: Secondary | ICD-10-CM
